# Patient Record
Sex: Male | Born: 1955 | Race: White | Hispanic: No | Marital: Married | Smoking: Current every day smoker
Health system: Southern US, Community
[De-identification: ages and names within clinical notes are randomized; demographics above are authoritative.]

## PROBLEM LIST (undated history)

## (undated) DIAGNOSIS — J449 Chronic obstructive pulmonary disease, unspecified: Secondary | ICD-10-CM

## (undated) DIAGNOSIS — K7460 Unspecified cirrhosis of liver: Secondary | ICD-10-CM

## (undated) DIAGNOSIS — C3490 Malignant neoplasm of unspecified part of unspecified bronchus or lung: Secondary | ICD-10-CM

---

## 1999-02-04 DIAGNOSIS — K7460 Unspecified cirrhosis of liver: Secondary | ICD-10-CM

## 1999-02-04 HISTORY — DX: Unspecified cirrhosis of liver: K74.60

## 2008-03-10 ENCOUNTER — Emergency Department (HOSPITAL_COMMUNITY): Admission: EM | Admit: 2008-03-10 | Discharge: 2008-03-10 | Payer: Self-pay | Admitting: Emergency Medicine

## 2010-05-21 LAB — DIFFERENTIAL
Basophils Absolute: 0.1 10*3/uL (ref 0.0–0.1)
Lymphocytes Relative: 30 % (ref 12–46)
Lymphs Abs: 2.1 10*3/uL (ref 0.7–4.0)
Neutro Abs: 4.1 10*3/uL (ref 1.7–7.7)
Neutrophils Relative %: 58 % (ref 43–77)

## 2010-05-21 LAB — COMPREHENSIVE METABOLIC PANEL
AST: 38 U/L — ABNORMAL HIGH (ref 0–37)
BUN: 14 mg/dL (ref 6–23)
CO2: 25 mEq/L (ref 19–32)
Calcium: 9 mg/dL (ref 8.4–10.5)
Chloride: 107 mEq/L (ref 96–112)
Creatinine, Ser: 0.79 mg/dL (ref 0.4–1.5)
GFR calc Af Amer: >60 mL/min (ref >60)
GFR calc non Af Amer: >60 mL/min (ref >60)
Glucose, Bld: 133 mg/dL — ABNORMAL HIGH (ref 70–99)
Total Bilirubin: 0.5 mg/dL (ref 0.3–1.2)

## 2010-05-21 LAB — LIPASE, BLOOD: Lipase: 19 U/L (ref 11–59)

## 2010-05-21 LAB — CBC
HCT: 40.7 % (ref 39.0–52.0)
MCHC: 34.8 g/dL (ref 30.0–36.0)
MCV: 86.9 fL (ref 78.0–100.0)
RBC: 4.68 MIL/uL (ref 4.22–5.81)

## 2013-04-23 DIAGNOSIS — I6529 Occlusion and stenosis of unspecified carotid artery: Secondary | ICD-10-CM | POA: Diagnosis not present

## 2013-04-23 DIAGNOSIS — J449 Chronic obstructive pulmonary disease, unspecified: Secondary | ICD-10-CM | POA: Diagnosis not present

## 2013-04-23 DIAGNOSIS — I6350 Cerebral infarction due to unspecified occlusion or stenosis of unspecified cerebral artery: Secondary | ICD-10-CM | POA: Diagnosis not present

## 2013-04-23 DIAGNOSIS — G4733 Obstructive sleep apnea (adult) (pediatric): Secondary | ICD-10-CM | POA: Diagnosis not present

## 2013-04-24 DIAGNOSIS — J449 Chronic obstructive pulmonary disease, unspecified: Secondary | ICD-10-CM | POA: Diagnosis not present

## 2013-04-24 DIAGNOSIS — I6350 Cerebral infarction due to unspecified occlusion or stenosis of unspecified cerebral artery: Secondary | ICD-10-CM | POA: Diagnosis not present

## 2013-04-24 DIAGNOSIS — I6529 Occlusion and stenosis of unspecified carotid artery: Secondary | ICD-10-CM | POA: Diagnosis not present

## 2013-04-24 DIAGNOSIS — G4733 Obstructive sleep apnea (adult) (pediatric): Secondary | ICD-10-CM | POA: Diagnosis not present

## 2013-04-25 DIAGNOSIS — I6350 Cerebral infarction due to unspecified occlusion or stenosis of unspecified cerebral artery: Secondary | ICD-10-CM | POA: Diagnosis not present

## 2013-04-25 DIAGNOSIS — E785 Hyperlipidemia, unspecified: Secondary | ICD-10-CM | POA: Diagnosis not present

## 2013-04-25 DIAGNOSIS — F319 Bipolar disorder, unspecified: Secondary | ICD-10-CM | POA: Diagnosis not present

## 2013-04-25 DIAGNOSIS — R209 Unspecified disturbances of skin sensation: Secondary | ICD-10-CM | POA: Diagnosis not present

## 2013-04-25 DIAGNOSIS — I69998 Other sequelae following unspecified cerebrovascular disease: Secondary | ICD-10-CM | POA: Diagnosis not present

## 2013-04-25 DIAGNOSIS — G4733 Obstructive sleep apnea (adult) (pediatric): Secondary | ICD-10-CM | POA: Diagnosis not present

## 2013-04-25 DIAGNOSIS — I6529 Occlusion and stenosis of unspecified carotid artery: Secondary | ICD-10-CM | POA: Diagnosis not present

## 2013-04-25 DIAGNOSIS — I10 Essential (primary) hypertension: Secondary | ICD-10-CM | POA: Diagnosis not present

## 2013-04-25 DIAGNOSIS — K7689 Other specified diseases of liver: Secondary | ICD-10-CM | POA: Diagnosis not present

## 2013-04-25 DIAGNOSIS — F17200 Nicotine dependence, unspecified, uncomplicated: Secondary | ICD-10-CM | POA: Diagnosis not present

## 2013-04-25 DIAGNOSIS — R5383 Other fatigue: Secondary | ICD-10-CM | POA: Diagnosis not present

## 2013-04-25 DIAGNOSIS — R5381 Other malaise: Secondary | ICD-10-CM | POA: Diagnosis not present

## 2013-04-25 DIAGNOSIS — E119 Type 2 diabetes mellitus without complications: Secondary | ICD-10-CM | POA: Diagnosis not present

## 2013-04-25 DIAGNOSIS — J449 Chronic obstructive pulmonary disease, unspecified: Secondary | ICD-10-CM | POA: Diagnosis not present

## 2013-04-26 DIAGNOSIS — I6529 Occlusion and stenosis of unspecified carotid artery: Secondary | ICD-10-CM | POA: Diagnosis not present

## 2013-04-26 DIAGNOSIS — G4733 Obstructive sleep apnea (adult) (pediatric): Secondary | ICD-10-CM | POA: Diagnosis not present

## 2013-04-26 DIAGNOSIS — I6350 Cerebral infarction due to unspecified occlusion or stenosis of unspecified cerebral artery: Secondary | ICD-10-CM | POA: Diagnosis not present

## 2013-04-26 DIAGNOSIS — J449 Chronic obstructive pulmonary disease, unspecified: Secondary | ICD-10-CM | POA: Diagnosis not present

## 2013-04-27 DIAGNOSIS — R5381 Other malaise: Secondary | ICD-10-CM | POA: Diagnosis not present

## 2013-04-27 DIAGNOSIS — J449 Chronic obstructive pulmonary disease, unspecified: Secondary | ICD-10-CM | POA: Diagnosis not present

## 2013-04-27 DIAGNOSIS — E785 Hyperlipidemia, unspecified: Secondary | ICD-10-CM | POA: Diagnosis not present

## 2013-04-27 DIAGNOSIS — I10 Essential (primary) hypertension: Secondary | ICD-10-CM | POA: Diagnosis not present

## 2013-04-27 DIAGNOSIS — R5383 Other fatigue: Secondary | ICD-10-CM | POA: Diagnosis not present

## 2013-04-27 DIAGNOSIS — E119 Type 2 diabetes mellitus without complications: Secondary | ICD-10-CM | POA: Diagnosis not present

## 2013-04-27 DIAGNOSIS — G4733 Obstructive sleep apnea (adult) (pediatric): Secondary | ICD-10-CM | POA: Diagnosis not present

## 2013-04-27 DIAGNOSIS — I6350 Cerebral infarction due to unspecified occlusion or stenosis of unspecified cerebral artery: Secondary | ICD-10-CM | POA: Diagnosis not present

## 2013-04-27 DIAGNOSIS — F17200 Nicotine dependence, unspecified, uncomplicated: Secondary | ICD-10-CM | POA: Diagnosis not present

## 2013-04-27 DIAGNOSIS — F319 Bipolar disorder, unspecified: Secondary | ICD-10-CM | POA: Diagnosis not present

## 2013-04-27 DIAGNOSIS — R209 Unspecified disturbances of skin sensation: Secondary | ICD-10-CM | POA: Diagnosis not present

## 2013-04-27 DIAGNOSIS — K7689 Other specified diseases of liver: Secondary | ICD-10-CM | POA: Diagnosis not present

## 2013-04-27 DIAGNOSIS — I6529 Occlusion and stenosis of unspecified carotid artery: Secondary | ICD-10-CM | POA: Diagnosis not present

## 2013-04-27 DIAGNOSIS — I69998 Other sequelae following unspecified cerebrovascular disease: Secondary | ICD-10-CM | POA: Diagnosis not present

## 2013-04-28 DIAGNOSIS — I6350 Cerebral infarction due to unspecified occlusion or stenosis of unspecified cerebral artery: Secondary | ICD-10-CM | POA: Diagnosis not present

## 2013-04-28 DIAGNOSIS — I6529 Occlusion and stenosis of unspecified carotid artery: Secondary | ICD-10-CM | POA: Diagnosis not present

## 2013-04-28 DIAGNOSIS — J449 Chronic obstructive pulmonary disease, unspecified: Secondary | ICD-10-CM | POA: Diagnosis not present

## 2013-04-28 DIAGNOSIS — G4733 Obstructive sleep apnea (adult) (pediatric): Secondary | ICD-10-CM | POA: Diagnosis not present

## 2013-06-01 DIAGNOSIS — I10 Essential (primary) hypertension: Secondary | ICD-10-CM | POA: Diagnosis not present

## 2013-06-01 DIAGNOSIS — G4733 Obstructive sleep apnea (adult) (pediatric): Secondary | ICD-10-CM | POA: Diagnosis not present

## 2013-06-01 DIAGNOSIS — I6529 Occlusion and stenosis of unspecified carotid artery: Secondary | ICD-10-CM | POA: Diagnosis not present

## 2015-04-09 DIAGNOSIS — J0100 Acute maxillary sinusitis, unspecified: Secondary | ICD-10-CM | POA: Diagnosis not present

## 2015-06-22 DIAGNOSIS — J0100 Acute maxillary sinusitis, unspecified: Secondary | ICD-10-CM | POA: Diagnosis not present

## 2017-01-13 DIAGNOSIS — J9611 Chronic respiratory failure with hypoxia: Secondary | ICD-10-CM | POA: Diagnosis not present

## 2017-01-13 DIAGNOSIS — Z85118 Personal history of other malignant neoplasm of bronchus and lung: Secondary | ICD-10-CM | POA: Diagnosis not present

## 2017-01-13 DIAGNOSIS — J9622 Acute and chronic respiratory failure with hypercapnia: Secondary | ICD-10-CM | POA: Diagnosis not present

## 2017-01-13 DIAGNOSIS — J181 Lobar pneumonia, unspecified organism: Secondary | ICD-10-CM | POA: Diagnosis not present

## 2017-01-13 DIAGNOSIS — Z720 Tobacco use: Secondary | ICD-10-CM | POA: Diagnosis not present

## 2017-01-13 DIAGNOSIS — Z91041 Radiographic dye allergy status: Secondary | ICD-10-CM | POA: Diagnosis not present

## 2017-01-13 DIAGNOSIS — R05 Cough: Secondary | ICD-10-CM | POA: Diagnosis not present

## 2017-01-13 DIAGNOSIS — R071 Chest pain on breathing: Secondary | ICD-10-CM | POA: Diagnosis not present

## 2017-01-13 DIAGNOSIS — J440 Chronic obstructive pulmonary disease with acute lower respiratory infection: Secondary | ICD-10-CM | POA: Diagnosis present

## 2017-01-13 DIAGNOSIS — Z8673 Personal history of transient ischemic attack (TIA), and cerebral infarction without residual deficits: Secondary | ICD-10-CM | POA: Diagnosis not present

## 2017-01-13 DIAGNOSIS — Z955 Presence of coronary angioplasty implant and graft: Secondary | ICD-10-CM | POA: Diagnosis not present

## 2017-01-13 DIAGNOSIS — Z7982 Long term (current) use of aspirin: Secondary | ICD-10-CM | POA: Diagnosis not present

## 2017-01-13 DIAGNOSIS — I252 Old myocardial infarction: Secondary | ICD-10-CM | POA: Diagnosis not present

## 2017-01-13 DIAGNOSIS — K219 Gastro-esophageal reflux disease without esophagitis: Secondary | ICD-10-CM | POA: Diagnosis present

## 2017-01-13 DIAGNOSIS — R918 Other nonspecific abnormal finding of lung field: Secondary | ICD-10-CM | POA: Diagnosis present

## 2017-01-13 DIAGNOSIS — J441 Chronic obstructive pulmonary disease with (acute) exacerbation: Secondary | ICD-10-CM | POA: Diagnosis not present

## 2017-01-13 DIAGNOSIS — E119 Type 2 diabetes mellitus without complications: Secondary | ICD-10-CM | POA: Diagnosis present

## 2017-01-13 DIAGNOSIS — R079 Chest pain, unspecified: Secondary | ICD-10-CM | POA: Diagnosis not present

## 2017-01-13 DIAGNOSIS — J9690 Respiratory failure, unspecified, unspecified whether with hypoxia or hypercapnia: Secondary | ICD-10-CM | POA: Diagnosis not present

## 2017-01-13 DIAGNOSIS — Z888 Allergy status to other drugs, medicaments and biological substances status: Secondary | ICD-10-CM | POA: Diagnosis not present

## 2017-01-13 DIAGNOSIS — J189 Pneumonia, unspecified organism: Secondary | ICD-10-CM | POA: Diagnosis not present

## 2017-01-13 DIAGNOSIS — J9621 Acute and chronic respiratory failure with hypoxia: Secondary | ICD-10-CM | POA: Diagnosis not present

## 2017-01-13 DIAGNOSIS — I2510 Atherosclerotic heart disease of native coronary artery without angina pectoris: Secondary | ICD-10-CM | POA: Diagnosis present

## 2017-01-13 DIAGNOSIS — Z9981 Dependence on supplemental oxygen: Secondary | ICD-10-CM | POA: Diagnosis not present

## 2017-01-13 DIAGNOSIS — Z79899 Other long term (current) drug therapy: Secondary | ICD-10-CM | POA: Diagnosis not present

## 2017-01-13 DIAGNOSIS — R0602 Shortness of breath: Secondary | ICD-10-CM | POA: Diagnosis not present

## 2017-01-13 DIAGNOSIS — F17210 Nicotine dependence, cigarettes, uncomplicated: Secondary | ICD-10-CM | POA: Diagnosis present

## 2017-01-13 DIAGNOSIS — J159 Unspecified bacterial pneumonia: Secondary | ICD-10-CM | POA: Diagnosis not present

## 2017-01-14 DIAGNOSIS — R0602 Shortness of breath: Secondary | ICD-10-CM

## 2017-01-16 DIAGNOSIS — Z9981 Dependence on supplemental oxygen: Secondary | ICD-10-CM | POA: Diagnosis not present

## 2017-01-16 DIAGNOSIS — J9611 Chronic respiratory failure with hypoxia: Secondary | ICD-10-CM | POA: Diagnosis not present

## 2017-01-16 DIAGNOSIS — J441 Chronic obstructive pulmonary disease with (acute) exacerbation: Secondary | ICD-10-CM | POA: Diagnosis not present

## 2017-01-17 DIAGNOSIS — J189 Pneumonia, unspecified organism: Secondary | ICD-10-CM

## 2017-01-20 DIAGNOSIS — G4733 Obstructive sleep apnea (adult) (pediatric): Secondary | ICD-10-CM | POA: Diagnosis not present

## 2017-01-20 DIAGNOSIS — R918 Other nonspecific abnormal finding of lung field: Secondary | ICD-10-CM | POA: Diagnosis not present

## 2017-01-20 DIAGNOSIS — F17210 Nicotine dependence, cigarettes, uncomplicated: Secondary | ICD-10-CM | POA: Diagnosis not present

## 2017-01-20 DIAGNOSIS — J301 Allergic rhinitis due to pollen: Secondary | ICD-10-CM | POA: Diagnosis not present

## 2017-01-20 DIAGNOSIS — J449 Chronic obstructive pulmonary disease, unspecified: Secondary | ICD-10-CM | POA: Diagnosis not present

## 2017-01-20 DIAGNOSIS — R5383 Other fatigue: Secondary | ICD-10-CM | POA: Diagnosis not present

## 2017-02-06 ENCOUNTER — Other Ambulatory Visit: Payer: Self-pay

## 2017-02-06 ENCOUNTER — Emergency Department (HOSPITAL_COMMUNITY): Payer: Medicare Other

## 2017-02-06 ENCOUNTER — Inpatient Hospital Stay (HOSPITAL_COMMUNITY)
Admission: EM | Admit: 2017-02-06 | Discharge: 2017-02-10 | DRG: 190 | Disposition: A | Discharge status: Home Health | Payer: Medicare Other | Attending: Internal Medicine | Admitting: Internal Medicine

## 2017-02-06 ENCOUNTER — Encounter (HOSPITAL_COMMUNITY): Payer: Self-pay | Admitting: Emergency Medicine

## 2017-02-06 DIAGNOSIS — J9621 Acute and chronic respiratory failure with hypoxia: Secondary | ICD-10-CM | POA: Diagnosis present

## 2017-02-06 DIAGNOSIS — J9620 Acute and chronic respiratory failure, unspecified whether with hypoxia or hypercapnia: Secondary | ICD-10-CM | POA: Diagnosis present

## 2017-02-06 DIAGNOSIS — Z9119 Patient's noncompliance with other medical treatment and regimen: Secondary | ICD-10-CM

## 2017-02-06 DIAGNOSIS — Z923 Personal history of irradiation: Secondary | ICD-10-CM

## 2017-02-06 DIAGNOSIS — Z9114 Patient's other noncompliance with medication regimen: Secondary | ICD-10-CM | POA: Diagnosis not present

## 2017-02-06 DIAGNOSIS — J440 Chronic obstructive pulmonary disease with acute lower respiratory infection: Secondary | ICD-10-CM | POA: Diagnosis present

## 2017-02-06 DIAGNOSIS — E785 Hyperlipidemia, unspecified: Secondary | ICD-10-CM | POA: Diagnosis present

## 2017-02-06 DIAGNOSIS — Z7902 Long term (current) use of antithrombotics/antiplatelets: Secondary | ICD-10-CM | POA: Diagnosis not present

## 2017-02-06 DIAGNOSIS — J9601 Acute respiratory failure with hypoxia: Secondary | ICD-10-CM

## 2017-02-06 DIAGNOSIS — G4733 Obstructive sleep apnea (adult) (pediatric): Secondary | ICD-10-CM | POA: Diagnosis present

## 2017-02-06 DIAGNOSIS — Z8673 Personal history of transient ischemic attack (TIA), and cerebral infarction without residual deficits: Secondary | ICD-10-CM

## 2017-02-06 DIAGNOSIS — E669 Obesity, unspecified: Secondary | ICD-10-CM | POA: Diagnosis present

## 2017-02-06 DIAGNOSIS — Z7951 Long term (current) use of inhaled steroids: Secondary | ICD-10-CM

## 2017-02-06 DIAGNOSIS — G9349 Other encephalopathy: Secondary | ICD-10-CM | POA: Diagnosis present

## 2017-02-06 DIAGNOSIS — Z9981 Dependence on supplemental oxygen: Secondary | ICD-10-CM

## 2017-02-06 DIAGNOSIS — N179 Acute kidney failure, unspecified: Secondary | ICD-10-CM | POA: Diagnosis present

## 2017-02-06 DIAGNOSIS — J441 Chronic obstructive pulmonary disease with (acute) exacerbation: Principal | ICD-10-CM

## 2017-02-06 DIAGNOSIS — I10 Essential (primary) hypertension: Secondary | ICD-10-CM | POA: Diagnosis present

## 2017-02-06 DIAGNOSIS — G894 Chronic pain syndrome: Secondary | ICD-10-CM | POA: Diagnosis present

## 2017-02-06 DIAGNOSIS — R069 Unspecified abnormalities of breathing: Secondary | ICD-10-CM | POA: Diagnosis not present

## 2017-02-06 DIAGNOSIS — J9602 Acute respiratory failure with hypercapnia: Secondary | ICD-10-CM

## 2017-02-06 DIAGNOSIS — K219 Gastro-esophageal reflux disease without esophagitis: Secondary | ICD-10-CM | POA: Diagnosis present

## 2017-02-06 DIAGNOSIS — Z6834 Body mass index (BMI) 34.0-34.9, adult: Secondary | ICD-10-CM

## 2017-02-06 DIAGNOSIS — Z85118 Personal history of other malignant neoplasm of bronchus and lung: Secondary | ICD-10-CM

## 2017-02-06 DIAGNOSIS — F17210 Nicotine dependence, cigarettes, uncomplicated: Secondary | ICD-10-CM | POA: Diagnosis present

## 2017-02-06 DIAGNOSIS — E1165 Type 2 diabetes mellitus with hyperglycemia: Secondary | ICD-10-CM | POA: Diagnosis present

## 2017-02-06 DIAGNOSIS — K869 Disease of pancreas, unspecified: Secondary | ICD-10-CM | POA: Diagnosis present

## 2017-02-06 DIAGNOSIS — I509 Heart failure, unspecified: Secondary | ICD-10-CM | POA: Diagnosis not present

## 2017-02-06 DIAGNOSIS — J9622 Acute and chronic respiratory failure with hypercapnia: Secondary | ICD-10-CM | POA: Diagnosis not present

## 2017-02-06 DIAGNOSIS — B9729 Other coronavirus as the cause of diseases classified elsewhere: Secondary | ICD-10-CM | POA: Diagnosis present

## 2017-02-06 DIAGNOSIS — J208 Acute bronchitis due to other specified organisms: Secondary | ICD-10-CM | POA: Diagnosis present

## 2017-02-06 DIAGNOSIS — Z7982 Long term (current) use of aspirin: Secondary | ICD-10-CM

## 2017-02-06 DIAGNOSIS — K7460 Unspecified cirrhosis of liver: Secondary | ICD-10-CM | POA: Diagnosis present

## 2017-02-06 DIAGNOSIS — E872 Acidosis: Secondary | ICD-10-CM | POA: Diagnosis present

## 2017-02-06 HISTORY — DX: Malignant neoplasm of unspecified part of unspecified bronchus or lung: C34.90

## 2017-02-06 HISTORY — DX: Chronic obstructive pulmonary disease, unspecified: J44.9

## 2017-02-06 LAB — I-STAT ARTERIAL BLOOD GAS, ED
ACID-BASE EXCESS: 5 mmol/L — AB (ref 0.0–2.0)
Acid-Base Excess: 3 mmol/L — ABNORMAL HIGH (ref 0.0–2.0)
BICARBONATE: 31.4 mmol/L — AB (ref 20.0–28.0)
BICARBONATE: 34.1 mmol/L — AB (ref 20.0–28.0)
O2 Saturation: 98 %
O2 Saturation: 97 %
PH ART: 7.303 — AB (ref 7.350–7.450)
TCO2: 36 mmol/L — AB (ref 22–32)
TCO2: 33 mmol/L — ABNORMAL HIGH (ref 22–32)
pCO2 arterial: 74 mmHg (ref 32.0–48.0)
pCO2 arterial: 63.4 mmHg — ABNORMAL HIGH (ref 32.0–48.0)
pH, Arterial: 7.272 — ABNORMAL LOW (ref 7.350–7.450)
pO2, Arterial: 128 mmHg — ABNORMAL HIGH (ref 83.0–108.0)
pO2, Arterial: 104 mmHg (ref 83.0–108.0)

## 2017-02-06 LAB — BASIC METABOLIC PANEL
Anion gap: 10 (ref 5–15)
BUN: 35 mg/dL — ABNORMAL HIGH (ref 6–20)
CALCIUM: 9.4 mg/dL (ref 8.9–10.3)
CO2: 34 mmol/L — AB (ref 22–32)
CREATININE: 1.56 mg/dL — AB (ref 0.61–1.24)
Chloride: 96 mmol/L — ABNORMAL LOW (ref 101–111)
GFR calc non Af Amer: 46 mL/min — ABNORMAL LOW (ref >60)
GFR, EST AFRICAN AMERICAN: 53 mL/min — AB (ref >60)
Glucose, Bld: 162 mg/dL — ABNORMAL HIGH (ref 65–99)
Potassium: 5.1 mmol/L (ref 3.5–5.1)
Sodium: 140 mmol/L (ref 135–145)

## 2017-02-06 LAB — CBC
HCT: 40.7 % (ref 39.0–52.0)
Hemoglobin: 12.2 g/dL — ABNORMAL LOW (ref 13.0–17.0)
MCH: 27.7 pg (ref 26.0–34.0)
MCHC: 30 g/dL (ref 30.0–36.0)
MCV: 92.3 fL (ref 78.0–100.0)
Platelets: 124 10*3/uL — ABNORMAL LOW (ref 150–400)
RBC: 4.41 MIL/uL (ref 4.22–5.81)
RDW: 17.6 % — ABNORMAL HIGH (ref 11.5–15.5)
WBC: 4.4 10*3/uL (ref 4.0–10.5)

## 2017-02-06 LAB — I-STAT TROPONIN, ED: TROPONIN I, POC: 0.03 ng/mL (ref 0.00–0.08)

## 2017-02-06 LAB — CBG MONITORING, ED: Glucose-Capillary: 150 mg/dL — ABNORMAL HIGH (ref 65–99)

## 2017-02-06 LAB — I-STAT CG4 LACTIC ACID, ED: Lactic Acid, Venous: 1.76 mmol/L (ref 0.5–1.9)

## 2017-02-06 MED ORDER — NALOXONE HCL 2 MG/2ML IJ SOSY
1.0000 mg | PREFILLED_SYRINGE | Freq: Once | INTRAMUSCULAR | Status: AC
  Administered 2017-02-06: 1 mg via INTRAVENOUS

## 2017-02-06 MED ORDER — ARFORMOTEROL TARTRATE 15 MCG/2ML IN NEBU
15.0000 ug | INHALATION_SOLUTION | Freq: Two times a day (BID) | RESPIRATORY_TRACT | Status: DC
  Administered 2017-02-07 – 2017-02-10 (×8): 15 ug via RESPIRATORY_TRACT
  Filled 2017-02-06 (×9): qty 2

## 2017-02-06 MED ORDER — NALOXONE HCL 2 MG/2ML IJ SOSY
PREFILLED_SYRINGE | INTRAMUSCULAR | Status: AC
  Filled 2017-02-06: qty 2

## 2017-02-06 MED ORDER — BUDESONIDE 0.25 MG/2ML IN SUSP
0.2500 mg | Freq: Two times a day (BID) | RESPIRATORY_TRACT | Status: DC
  Administered 2017-02-07 (×2): 0.25 mg via RESPIRATORY_TRACT
  Filled 2017-02-06 (×3): qty 2

## 2017-02-06 MED ORDER — INSULIN ASPART 100 UNIT/ML ~~LOC~~ SOLN: 2.0000 [IU] | SUBCUTANEOUS | Status: DC

## 2017-02-06 MED ORDER — NALOXONE HCL 0.4 MG/ML IJ SOLN
0.4000 mg | Freq: Once | INTRAMUSCULAR | Status: AC
  Administered 2017-02-06: 0.4 mg via INTRAVENOUS

## 2017-02-06 MED ORDER — ALBUTEROL (5 MG/ML) CONTINUOUS INHALATION SOLN
10.0000 mg/h | INHALATION_SOLUTION | RESPIRATORY_TRACT | Status: DC
  Administered 2017-02-06: 10 mg/h via RESPIRATORY_TRACT
  Filled 2017-02-06: qty 20

## 2017-02-06 MED ORDER — IPRATROPIUM-ALBUTEROL 0.5-2.5 (3) MG/3ML IN SOLN
3.0000 mL | Freq: Four times a day (QID) | RESPIRATORY_TRACT | Status: DC
  Administered 2017-02-07 – 2017-02-10 (×12): 3 mL via RESPIRATORY_TRACT
  Filled 2017-02-06 (×13): qty 3

## 2017-02-06 MED ORDER — PANTOPRAZOLE SODIUM 40 MG IV SOLR
40.0000 mg | INTRAVENOUS | Status: DC
  Administered 2017-02-07 (×2): 40 mg via INTRAVENOUS
  Filled 2017-02-06 (×2): qty 40

## 2017-02-06 MED ORDER — HEPARIN SODIUM (PORCINE) 5000 UNIT/ML IJ SOLN
5000.0000 [IU] | Freq: Three times a day (TID) | INTRAMUSCULAR | Status: DC
  Administered 2017-02-07 – 2017-02-10 (×9): 5000 [IU] via SUBCUTANEOUS
  Filled 2017-02-06 (×9): qty 1

## 2017-02-06 NOTE — ED Notes (Signed)
Additional family at bedside

## 2017-02-06 NOTE — ED Notes (Signed)
Repeat ABG done, RT and Dr. Jeneen Rinks discussing plan of care.  Wife at bedside states pt has been in same condition in the past and worse. Awaiting son to arrive for further discussion

## 2017-02-06 NOTE — ED Triage Notes (Signed)
Patient from home with increased shortness of breath in the last day.  Patient with decreased mental status per EMS en route to ED.  Patient has history of COPD, wheezing and rales in all fields.  Patient was given albuterol treatment en route to ED.  Patient placed on Bipap on arrival to ED.

## 2017-02-07 ENCOUNTER — Encounter (HOSPITAL_COMMUNITY): Payer: Self-pay | Admitting: *Deleted

## 2017-02-07 ENCOUNTER — Other Ambulatory Visit: Payer: Self-pay

## 2017-02-07 DIAGNOSIS — J9622 Acute and chronic respiratory failure with hypercapnia: Secondary | ICD-10-CM

## 2017-02-07 DIAGNOSIS — J441 Chronic obstructive pulmonary disease with (acute) exacerbation: Secondary | ICD-10-CM | POA: Insufficient documentation

## 2017-02-07 DIAGNOSIS — J9602 Acute respiratory failure with hypercapnia: Secondary | ICD-10-CM | POA: Insufficient documentation

## 2017-02-07 LAB — RESPIRATORY PANEL BY PCR
ADENOVIRUS-RVPPCR: NOT DETECTED
Bordetella pertussis: NOT DETECTED
CORONAVIRUS HKU1-RVPPCR: NOT DETECTED
CORONAVIRUS NL63-RVPPCR: NOT DETECTED
CORONAVIRUS OC43-RVPPCR: DETECTED — AB
Chlamydophila pneumoniae: NOT DETECTED
Coronavirus 229E: NOT DETECTED
INFLUENZA A-RVPPCR: NOT DETECTED
Influenza B: NOT DETECTED
Metapneumovirus: NOT DETECTED
Mycoplasma pneumoniae: NOT DETECTED
PARAINFLUENZA VIRUS 1-RVPPCR: NOT DETECTED
PARAINFLUENZA VIRUS 2-RVPPCR: NOT DETECTED
PARAINFLUENZA VIRUS 4-RVPPCR: NOT DETECTED
Parainfluenza Virus 3: NOT DETECTED
Respiratory Syncytial Virus: NOT DETECTED
Rhinovirus / Enterovirus: NOT DETECTED

## 2017-02-07 LAB — URINALYSIS, ROUTINE W REFLEX MICROSCOPIC
Bilirubin Urine: NEGATIVE
Glucose, UA: 50 mg/dL — AB
Hgb urine dipstick: NEGATIVE
Ketones, ur: NEGATIVE mg/dL
LEUKOCYTES UA: NEGATIVE
Nitrite: NEGATIVE
Protein, ur: NEGATIVE mg/dL
SPECIFIC GRAVITY, URINE: 1.017 (ref 1.005–1.030)
pH: 5 (ref 5.0–8.0)

## 2017-02-07 LAB — GLUCOSE, CAPILLARY
GLUCOSE-CAPILLARY: 129 mg/dL — AB (ref 65–99)
GLUCOSE-CAPILLARY: 180 mg/dL — AB (ref 65–99)
GLUCOSE-CAPILLARY: 227 mg/dL — AB (ref 65–99)
GLUCOSE-CAPILLARY: 241 mg/dL — AB (ref 65–99)
Glucose-Capillary: 148 mg/dL — ABNORMAL HIGH (ref 65–99)
Glucose-Capillary: 216 mg/dL — ABNORMAL HIGH (ref 65–99)

## 2017-02-07 LAB — CBG MONITORING, ED: GLUCOSE-CAPILLARY: 259 mg/dL — AB (ref 65–99)

## 2017-02-07 LAB — BLOOD GAS, ARTERIAL
ACID-BASE EXCESS: 8.6 mmol/L — AB (ref 0.0–2.0)
Bicarbonate: 34 mmol/L — ABNORMAL HIGH (ref 20.0–28.0)
DRAWN BY: 51702
Delivery systems: POSITIVE
Expiratory PAP: 5
FIO2: 40
INSPIRATORY PAP: 17
O2 SAT: 96.5 %
PCO2 ART: 61.1 mmHg — AB (ref 32.0–48.0)
PO2 ART: 91.9 mmHg (ref 83.0–108.0)
Patient temperature: 98.6
pH, Arterial: 7.364 (ref 7.350–7.450)

## 2017-02-07 LAB — BASIC METABOLIC PANEL
Anion gap: 13 (ref 5–15)
BUN: 39 mg/dL — AB (ref 6–20)
CHLORIDE: 95 mmol/L — AB (ref 101–111)
CO2: 32 mmol/L (ref 22–32)
CREATININE: 1.49 mg/dL — AB (ref 0.61–1.24)
Calcium: 9.2 mg/dL (ref 8.9–10.3)
GFR calc Af Amer: 56 mL/min — ABNORMAL LOW (ref >60)
GFR calc non Af Amer: 49 mL/min — ABNORMAL LOW (ref >60)
GLUCOSE: 247 mg/dL — AB (ref 65–99)
POTASSIUM: 3.9 mmol/L (ref 3.5–5.1)
SODIUM: 140 mmol/L (ref 135–145)

## 2017-02-07 LAB — HEPATIC FUNCTION PANEL
ALBUMIN: 3.1 g/dL — AB (ref 3.5–5.0)
ALK PHOS: 97 U/L (ref 38–126)
ALT: 40 U/L (ref 17–63)
AST: 36 U/L (ref 15–41)
BILIRUBIN INDIRECT: 0.6 mg/dL (ref 0.3–0.9)
Bilirubin, Direct: 0.2 mg/dL (ref 0.1–0.5)
TOTAL PROTEIN: 6.9 g/dL (ref 6.5–8.1)
Total Bilirubin: 0.8 mg/dL (ref 0.3–1.2)

## 2017-02-07 LAB — INFLUENZA PANEL BY PCR (TYPE A & B)
Influenza A By PCR: NEGATIVE
Influenza B By PCR: NEGATIVE

## 2017-02-07 LAB — CBC
HEMATOCRIT: 36.3 % — AB (ref 39.0–52.0)
Hemoglobin: 11.2 g/dL — ABNORMAL LOW (ref 13.0–17.0)
MCH: 27.9 pg (ref 26.0–34.0)
MCHC: 30.9 g/dL (ref 30.0–36.0)
MCV: 90.5 fL (ref 78.0–100.0)
PLATELETS: 120 10*3/uL — AB (ref 150–400)
RBC: 4.01 MIL/uL — ABNORMAL LOW (ref 4.22–5.81)
RDW: 17.6 % — AB (ref 11.5–15.5)
WBC: 2.7 10*3/uL — AB (ref 4.0–10.5)

## 2017-02-07 LAB — MAGNESIUM: Magnesium: 2.1 mg/dL (ref 1.7–2.4)

## 2017-02-07 LAB — RAPID URINE DRUG SCREEN, HOSP PERFORMED
AMPHETAMINES: NOT DETECTED
BARBITURATES: NOT DETECTED
BENZODIAZEPINES: NOT DETECTED
Cocaine: NOT DETECTED
Opiates: POSITIVE — AB
Tetrahydrocannabinol: POSITIVE — AB

## 2017-02-07 LAB — PHOSPHORUS: Phosphorus: 2.9 mg/dL (ref 2.5–4.6)

## 2017-02-07 LAB — LACTIC ACID, PLASMA
LACTIC ACID, VENOUS: 3.9 mmol/L — AB (ref 0.5–1.9)
Lactic Acid, Venous: 4.4 mmol/L (ref 0.5–1.9)

## 2017-02-07 LAB — MRSA PCR SCREENING: MRSA by PCR: NEGATIVE

## 2017-02-07 LAB — HIV ANTIBODY (ROUTINE TESTING W REFLEX): HIV SCREEN 4TH GENERATION: NONREACTIVE

## 2017-02-07 LAB — AMMONIA: AMMONIA: 40 umol/L — AB (ref 9–35)

## 2017-02-07 LAB — PROCALCITONIN: Procalcitonin: 0.11 ng/mL

## 2017-02-07 MED ORDER — METHYLPREDNISOLONE SODIUM SUCC 40 MG IJ SOLR
40.0000 mg | Freq: Every day | INTRAMUSCULAR | Status: DC
  Administered 2017-02-07 – 2017-02-08 (×2): 40 mg via INTRAVENOUS
  Filled 2017-02-07 (×2): qty 1

## 2017-02-07 MED ORDER — BUDESONIDE 0.5 MG/2ML IN SUSP
0.5000 mg | Freq: Two times a day (BID) | RESPIRATORY_TRACT | Status: DC
  Administered 2017-02-07 – 2017-02-10 (×6): 0.5 mg via RESPIRATORY_TRACT
  Filled 2017-02-07 (×6): qty 2

## 2017-02-07 MED ORDER — ORAL CARE MOUTH RINSE
15.0000 mL | Freq: Two times a day (BID) | OROMUCOSAL | Status: DC
  Administered 2017-02-07 – 2017-02-09 (×4): 15 mL via OROMUCOSAL

## 2017-02-07 MED ORDER — FENTANYL CITRATE (PF) 100 MCG/2ML IJ SOLN
25.0000 ug | INTRAMUSCULAR | Status: DC | PRN
  Administered 2017-02-07 (×4): 50 ug via INTRAVENOUS
  Filled 2017-02-07 (×4): qty 2

## 2017-02-07 MED ORDER — INSULIN ASPART 100 UNIT/ML ~~LOC~~ SOLN
0.0000 [IU] | SUBCUTANEOUS | Status: DC
  Administered 2017-02-07: 8 [IU] via SUBCUTANEOUS
  Administered 2017-02-07: 5 [IU] via SUBCUTANEOUS
  Administered 2017-02-07: 3 [IU] via SUBCUTANEOUS
  Administered 2017-02-07: 5 [IU] via SUBCUTANEOUS
  Administered 2017-02-07: 2 [IU] via SUBCUTANEOUS
  Administered 2017-02-07: 5 [IU] via SUBCUTANEOUS
  Administered 2017-02-07 – 2017-02-08 (×2): 2 [IU] via SUBCUTANEOUS
  Administered 2017-02-08 (×2): 8 [IU] via SUBCUTANEOUS
  Administered 2017-02-08 (×2): 2 [IU] via SUBCUTANEOUS
  Administered 2017-02-08: 5 [IU] via SUBCUTANEOUS
  Administered 2017-02-09 (×2): 2 [IU] via SUBCUTANEOUS
  Administered 2017-02-09: 15 [IU] via SUBCUTANEOUS
  Filled 2017-02-07: qty 1

## 2017-02-07 MED ORDER — OXYCODONE HCL 5 MG PO TABS
5.0000 mg | ORAL_TABLET | Freq: Three times a day (TID) | ORAL | Status: DC | PRN
  Administered 2017-02-07 – 2017-02-10 (×8): 5 mg via ORAL
  Filled 2017-02-07 (×9): qty 1

## 2017-02-07 MED ORDER — PREGABALIN 75 MG PO CAPS
75.0000 mg | ORAL_CAPSULE | Freq: Two times a day (BID) | ORAL | Status: DC
  Administered 2017-02-07 – 2017-02-10 (×7): 75 mg via ORAL
  Filled 2017-02-07 (×7): qty 1

## 2017-02-07 NOTE — Progress Notes (Signed)
Mr. Manuel Baxter, previously noncompliant with CPAP at home, requests CPAP therapy for tonight.  Patient placed on CPAP, autotitration mode (min 5, max 20) using under the nose full face mask.  Patient states he has full face mask at home but does not use as he is supposed to.  Patient and family express need for equipment adjustment with home CPAP equipment.  Patient tolerates well.  3L O2 bleed in applied.  Patient has strong congested cough productive of thick yellow sputum.  Patient was instructed to notify RN if he removes CPAP during the night so the nasal cannula can be reapplied.  Patient expresses understanding.

## 2017-02-07 NOTE — Progress Notes (Signed)
[facility]M Follow Up AM Rounding   S:  RN reports pt asking for pain medications.  Has PRN fentanyl only.  No acute events overnight.    O: Blood pressure (!) 125/59, pulse 66, temperature 98 F (36.7 C), temperature source Oral, resp. rate 11, height 5\' 7"  (1.702 m), weight 167 lb 12.3 oz (76.1 kg), SpO2 100 %.  General: chronically ill appearing male in NAD HEENT: MM pink/moist PSY: calm  Neuro: AAOx4, speech clear, MAE  CV: s1s2 rrr, no m/r/g PULM: even/non-labored, lungs bilaterally coarse with wheezing  DU:KRCV, non-tender, bsx4 active  Extremities: warm/dry, no edema  Skin: no rashes or lesions  Recent Labs  Lab 02/06/17 1950 02/07/17 0321  HGB 12.2* 11.2*  HCT 40.7 36.3*  WBC 4.4 2.7*  PLT 124* 120*   Recent Labs  Lab 02/06/17 1950 02/07/17 0321  NA 140 140  K 5.1 3.9  CL 96* 95*  CO2 34* 32  GLUCOSE 162* 247*  BUN 35* 39*  CREATININE 1.56* 1.49*  CALCIUM 9.4 9.2  MG  --  2.1  PHOS  --  2.9     A: Severe COPD  Coronavirus Positive  Non-Small Cell Lung CA  Right Hilar ATX vs Mass  Ongoing Tobacco Abuse  Acute on Chronic Respiratory Failure  CAD - on dual antiplatelet  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  Wife reports he has a PET scan arranged with Dr. Alcide Clever in [place].   Global - likely can transfer out of ICU later this afternoon.   Noe Gens, NP-C [facility] Pgr: [phone] or if no answer [phone] 02/07/2017,  11:40 AM

## 2017-02-07 NOTE — ED Provider Notes (Addendum)
[facility] EMERGENCY DEPARTMENT Provider Note   CSN: [id number] Arrival date & time: 02/06/17  1946     History   Chief Complaint Chief Complaint  Patient presents with  . Respiratory Distress    HPI Manuel Baxter is a 62 y.o. male. CC:  SOB  HPI:  62 y/o male. H/o COPD admitted at [facility] with pneumonia and Resp failure 4 weeks ago. DC'd with abx and steroids.  Has been staying with son since. Seemed "OK" this am. Altered and SOB this afternoon when son returned from work.   EMS called. "more confused" en route" per ems. Normal CBG.  [facility]--"they didn't send Rx".   Past Medical History:  Diagnosis Date  . COPD (chronic obstructive pulmonary disease) ([place])   . Lung cancer [facility])     Patient Active Problem List   Diagnosis Date Noted  . Acute on chronic respiratory failure ([place]) 02/06/2017    History reviewed. No pertinent surgical history.     Home Medications    Prior to Admission medications   Medication Sig Start Date End Date Taking? Authorizing Provider  albuterol (PROAIR HFA) 108 (90 Base) MCG/ACT inhaler Inhale 2 puffs into the lungs every 6 (six) hours as needed for wheezing or shortness of breath.   Yes [provider]  ALBUTEROL IN Take 1 vial by nebulization every 4 (four) hours as needed (shortness of breath/wheezing).   Yes [provider]  Alum Hydroxide-Mag Carbonate (GAVISCON PO) Take 1 tablet by mouth daily as needed (indigestion/heartburn).   Yes [provider]  aspirin EC 81 MG tablet Take 81 mg by mouth daily.   Yes [provider]  atenolol (TENORMIN) 50 MG tablet Take 50 mg by mouth 2 (two) times daily.   Yes [provider]  Cholecalciferol (VITAMIN D) 2000 units tablet Take 2,000 Units by mouth 2 (two) times daily.   Yes [provider]  clopidogrel (PLAVIX) 75 MG tablet Take 75 mg by mouth daily with supper.   Yes [provider]  cyanocobalamin 500 MCG tablet Take 500 mcg by mouth 2 (two) times daily. Vitamin B12   Yes [provider]  DULoxetine (CYMBALTA) 30 MG capsule Take 30 mg by mouth daily.   Yes [provider]  Fluticasone Furoate-Vilanterol (BREO ELLIPTA IN) Inhale 1 puff into the lungs daily.   Yes [provider]  glipiZIDE (GLUCOTROL) 5 MG tablet Take 2.5 mg by mouth 2 (two) times daily before a meal.   Yes [provider]  loratadine (CLARITIN) 10 MG tablet Take 10 mg by mouth daily.   Yes [provider]  montelukast (SINGULAIR) 10 MG tablet Take 10 mg by mouth daily with supper.   Yes [provider]  Omega-3 Fatty Acids (FISH OIL) 1000 MG CAPS Take 1,000-2,000 mg by mouth See admin instructions. Take 2 capsules (2000 mg) by mouth every morning and 1 capsule (1000 mg) before supper   Yes [provider]  omeprazole (PRILOSEC) 20 MG capsule Take 20 mg by mouth daily.   Yes [provider]  OXYGEN Inhale 2.5-3 L into the lungs continuous.   Yes [provider]  pregabalin (LYRICA) 75 MG capsule Take 75 mg by mouth 3 (three) times daily as needed (pain).   Yes [provider]  tiotropium (SPIRIVA) 18 MCG inhalation capsule Place 18 mcg into inhaler and inhale every evening.   Yes [provider]  triamterene-hydrochlorothiazide (  MAXZIDE-25) 37.5-25 MG tablet Take 1 tablet by mouth daily.   Yes [provider]    Family History No family history on file.  Social History Social History   Tobacco Use  . Smoking status: Not on file  Substance Use Topics  . Alcohol use: Not on file  . Drug use: Not on file     Allergies   Doxepin; Lisinopril; Acetaminophen; Contrast media [iodinated diagnostic agents]; Gabapentin; and Prozac [fluoxetine hcl]   Review of Systems Review of Systems  Unable to perform ROS: Mental status change     Physical Exam Updated Vital Signs BP 133/73    Pulse 89   Temp (!) 97.5 F (36.4 C) (Oral)   Resp 15   SpO2 100%   Physical Exam  Constitutional: No distress.   Tachypneic. Confused. BiPAP in place.   HENT:  Head: Normocephalic.  Eyes: Conjunctivae are normal. Pupils are equal, round, and reactive to light. No scleral icterus.  Neck: Normal range of motion. Neck supple. No thyromegaly present.  Cardiovascular: Normal rate and regular rhythm. Exam reveals no gallop and no friction rub.  No murmur heard. Pulmonary/Chest: He is in respiratory distress. He has wheezes. He has no rales.  Abdominal: Soft. Bowel sounds are normal. He exhibits no distension. There is no tenderness. There is no rebound.  Musculoskeletal: Normal range of motion.  Neurological: He is alert.  Skin: Skin is warm and dry. No rash noted.  Psychiatric: He has a normal mood and affect. His behavior is normal.     ED Treatments / Results  Labs (all labs ordered are listed, but only abnormal results are displayed) Labs Reviewed  BASIC METABOLIC PANEL - Abnormal; Notable for the following components:      Result Value   Chloride 96 (*)    CO2 34 (*)    Glucose, Bld 162 (*)    BUN 35 (*)    Creatinine, Ser 1.56 (*)    GFR calc non Af Amer 46 (*)    GFR calc Af Amer 53 (*)    All other components within normal limits  CBC - Abnormal; Notable for the following components:   Hemoglobin 12.2 (*)    RDW 17.6 (*)    Platelets 124 (*)    All other components within normal limits  I-STAT ARTERIAL BLOOD GAS, ED - Abnormal; Notable for the following components:   pH, Arterial 7.272 (*)    pCO2 arterial 74.0 (*)    pO2, Arterial 128.0 (*)    Bicarbonate 34.1 (*)    TCO2 36 (*)    Acid-Base Excess 5.0 (*)    All other components within normal limits  CBG MONITORING, ED - Abnormal; Notable for the following components:   Glucose-Capillary 150 (*)    All other components within normal limits  I-STAT ARTERIAL BLOOD GAS, ED - Abnormal; Notable for the  following components:   pH, Arterial 7.303 (*)    pCO2 arterial 63.4 (*)    Bicarbonate 31.4 (*)    TCO2 33 (*)    Acid-Base Excess 3.0 (*)    All other components within normal limits  RESPIRATORY PANEL BY PCR  CULTURE, BLOOD (ROUTINE X 2)  CULTURE, BLOOD (ROUTINE X 2)  CULTURE, EXPECTORATED SPUTUM-ASSESSMENT  RAPID URINE DRUG SCREEN, HOSP PERFORMED  INFLUENZA PANEL BY PCR (TYPE A & B)  PROCALCITONIN  PROCALCITONIN  LACTIC ACID, PLASMA  LACTIC ACID, PLASMA  BLOOD GAS, ARTERIAL  BASIC METABOLIC PANEL  CBC  MAGNESIUM  PHOSPHORUS  HIV ANTIBODY (ROUTINE TESTING)  URINALYSIS, ROUTINE W REFLEX MICROSCOPIC  I-STAT TROPONIN, ED  I-STAT CG4 LACTIC ACID, ED    EKG  EKG Interpretation None       Radiology Dg Chest Portable 1 View  Result Date: 02/06/2017 CLINICAL DATA:  Shortness of breath. History of radiated right upper lobe cancer. Per epic notes patient is imaged at the [place]. EXAM: PORTABLE CHEST 1 VIEW COMPARISON:  01/15/2017 FINDINGS: Linear opacity about the full right hilum. Patient has history of radiated lung cancer in the right upper lobe. No edema, effusion, or air bronchogram. No pneumothorax. Normal heart size. IMPRESSION: New atelectasis around the patient's right perihilar mass. Patient has history of treated right lung cancer. Please ensure patient has oncology follow-up to ensure this change is not a sign of progression/recurrence. Electronically Signed   By: Monte Fantasia M.D.   On: 02/06/2017 20:34    Procedures Procedures (including critical care time)  Medications Ordered in ED Medications  albuterol (PROVENTIL,VENTOLIN) solution continuous neb (10 mg/hr Nebulization New Bag/Given 02/06/17 2004)  insulin aspart (novoLOG) injection 2-6 Units (not administered)  arformoterol (BROVANA) nebulizer solution 15 mcg (15 mcg Nebulization Given 02/07/17 0017)  ipratropium-albuterol (DUONEB) 0.5-2.5 (3) MG/3ML nebulizer solution 3 mL (not administered)  budesonide  (PULMICORT) nebulizer solution 0.25 mg (0.25 mg Nebulization Given 02/07/17 0017)  pantoprazole (PROTONIX) injection 40 mg (not administered)  heparin injection 5,000 Units (not administered)  naloxone (NARCAN) injection 0.4 mg (0.4 mg Intravenous Given 02/06/17 1949)  naloxone (NARCAN) injection 1 mg (1 mg Intravenous Given 02/06/17 2014)     Initial Impression / Assessment and Plan / ED Course  I have reviewed the triage vital signs and the nursing notes.  Pertinent labs & imaging results that were available during my care of the patient were reviewed by me and considered in my medical decision making (see chart for details).    Slight improvement with Narcan 0.4 then 1mg .  Continued on BiPap and continuous neb. First gas with Resp acidosis and PCO2 72, 2nd gas minimally changed. Pt continues to respond to voice. Discussed with Intensivist.   CRITICAL CARE Performed by: Lolita Patella   Total critical care time: 60 minutes  Critical care time was exclusive of separately billable procedures and treating other patients.  Critical care was necessary to treat or prevent imminent or life-threatening deterioration.  Critical care was time spent personally by me on the following activities: development of treatment plan with patient and/or surrogate as well as nursing, discussions with consultants, evaluation of patient's response to treatment, examination of patient, obtaining history from patient or surrogate, ordering and performing treatments and interventions, ordering and review of laboratory studies, ordering and review of radiographic studies, pulse oximetry and re-evaluation of patient's condition.   Final Clinical Impressions(s) / ED Diagnoses   Final diagnoses:  COPD exacerbation ([place])  Acute respiratory failure with hypercapnia ([place])   No eminent need for intubation or PPV.  ICU team in room.    ED Discharge Orders    None       Tanna Furry, MD 02/07/17 Quentin Mulling      Tanna Furry, MD 02/25/17 [phone]

## 2017-02-07 NOTE — Progress Notes (Signed)
[facility] Progress Note Patient Name: Manuel Baxter DOB: Jan 08, 1956 MRN: [id number]   Date of Service  02/07/2017  HPI/Events of Note  Chronic pain  eICU Interventions  Fentanyl prn     Intervention Category Intermediate Interventions: Pain - evaluation and management  Simonne Maffucci 02/07/2017, 1:22 AM

## 2017-02-07 NOTE — H&P (Signed)
PULMONARY / CRITICAL CARE MEDICINE   Name: Manuel Baxter MRN: [id number] DOB: 1956/01/09    ADMISSION DATE:  02/06/2017 CONSULTATION DATE:  02/07/2017   REFERRING MD:  Dr. 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 Recent admission with respiratory failure secondary to PNA.   On 1/4 patient was take to ER for increasing confusion and lethargy. Family reports that since discharge he has been living with son and daughter-in-law. He is non-compliant with medications and CPAP usage, family with find medications stashed in his pockets and around the house. During the day he is home alone as family members are working. For the last few days he has been less interactive and sleepy. Will only wear CPAP for a couple of minutes before taking it off, tells family it is not working.   Upon arrival to ED, patient is lethargic with stable vital signs. CXR with no acute. ABG 7.272/74/128. LA 1.76. Troponin 0.03. WBC 4.4. Placed on BiPAP. PCCM asked to admit.   PAST MEDICAL HISTORY :  He  has a past medical history of COPD (chronic obstructive pulmonary disease) ([place]) and Lung cancer ([place]).  PAST SURGICAL HISTORY: He  has no past surgical history on file.  Allergies  Allergen Reactions  . Doxepin Anaphylaxis  . Lisinopril Anaphylaxis  . Acetaminophen Other (See Comments)    Elevates liver enzymes,has hx of cirrhosis ( non-alcoholic )  . Contrast Media [Iodinated Diagnostic Agents] Other (See Comments)    Stopped breathing  . Gabapentin Other (See Comments)    Tremors, loopy  . Prozac [Fluoxetine Hcl] Other (See Comments)    Makes him  mean    No current facility-administered medications on file prior to encounter.    Current Outpatient Medications on File Prior to Encounter  Medication Sig  . albuterol (PROAIR HFA) 108 (90 Base) MCG/ACT inhaler Inhale 2 puffs into the lungs every 6 (six) hours as needed for wheezing or shortness of breath.  . ALBUTEROL IN Take 1 vial by nebulization every 4 (four) hours as needed (shortness of breath/wheezing).  . Alum Hydroxide-Mag Carbonate (GAVISCON PO) Take 1 tablet by mouth daily as needed (indigestion/heartburn).  Marland Kitchen aspirin EC 81 MG tablet Take 81 mg by mouth daily.  Marland Kitchen atenolol (TENORMIN) 50 MG tablet Take 50 mg by mouth 2 (two) times daily.  . Cholecalciferol (VITAMIN D) 2000 units tablet Take 2,000 Units by mouth 2 (two) times daily.  . clopidogrel (PLAVIX) 75 MG tablet Take 75 mg by mouth daily with supper.  . cyanocobalamin 500 MCG tablet Take 500 mcg by mouth 2 (two) times daily. Vitamin B12  . DULoxetine (CYMBALTA) 30 MG capsule Take 30 mg by mouth daily.  . Fluticasone Furoate-Vilanterol (BREO ELLIPTA IN) Inhale 1 puff into the lungs daily.  Marland Kitchen glipiZIDE (GLUCOTROL) 5 MG tablet Take 2.5 mg by mouth 2 (two) times daily before a meal.  . loratadine (CLARITIN) 10 MG tablet Take 10 mg by mouth daily.  . montelukast (SINGULAIR) 10 MG tablet Take 10 mg by mouth daily with supper.  . Omega-3 Fatty Acids (FISH OIL) 1000 MG CAPS Take 1,000-2,000 mg by mouth See admin instructions. Take  2 capsules (2000 mg) by mouth every morning and 1 capsule (1000 mg) before supper  . omeprazole (PRILOSEC) 20 MG capsule Take 20 mg by mouth daily.  . OXYGEN Inhale 2.5-3 L into the lungs continuous.  . pregabalin (LYRICA) 75 MG capsule Take 75 mg by mouth 3 (three) times daily as needed (pain).  Marland Kitchen tiotropium (SPIRIVA) 18 MCG inhalation capsule Place 18 mcg into inhaler and inhale every evening.  . triamterene-hydrochlorothiazide (MAXZIDE-25) 37.5-25 MG tablet Take 1 tablet by mouth daily.    FAMILY  HISTORY:  His has no family status information on file.    SOCIAL HISTORY: He    REVIEW OF SYSTEMS:   Unable to review as patient is encephalopathic   SUBJECTIVE:   VITAL SIGNS: BP 133/73   Pulse 89   Temp (!) 97.5 F (36.4 C) (Oral)   Resp 15   SpO2 100%   HEMODYNAMICS:    VENTILATOR SETTINGS: Vent Mode: PCV;BIPAP FiO2 (%):  [40 %] 40 % Set Rate:  [15 bmp] 15 bmp PEEP:  [5 cmH20] 5 cmH20  INTAKE / OUTPUT: No intake/output data recorded.  PHYSICAL EXAMINATION: General:  Adult male, on Bipap  Neuro:  Lethargic, moves extremities, does not follow commands   HEENT:  Dry MM  Cardiovascular:  RRR, no MRG  Lungs:  Exp Wheeze, no crackles, non-labored  Abdomen:  Obese, active bowel sounds  Musculoskeletal:  -edema  Skin:  Warm, dry, intact   LABS:  BMET Recent Labs  Lab 02/06/17 1950  NA 140  K 5.1  CL 96*  CO2 34*  BUN 35*  CREATININE 1.56*  GLUCOSE 162*    Electrolytes Recent Labs  Lab 02/06/17 1950  CALCIUM 9.4    CBC Recent Labs  Lab 02/06/17 1950  WBC 4.4  HGB 12.2*  HCT 40.7  PLT 124*    Coag's No results for input(s): APTT, INR in the last 168 hours.  Sepsis Markers Recent Labs  Lab 02/06/17 2010  LATICACIDVEN 1.76    ABG Recent Labs  Lab 02/06/17 2141 02/06/17 2332  PHART 7.272* 7.303*  PCO2ART 74.0* 63.4*  PO2ART 128.0* 104.0    Liver Enzymes No results for input(s): AST, ALT, ALKPHOS, BILITOT, ALBUMIN in the last 168 hours.  Cardiac Enzymes No results for input(s): TROPONINI, PROBNP in the last 168 hours.  Glucose Recent Labs  Lab 02/06/17 1948  GLUCAP 150*    Imaging Dg Chest Portable 1 View  Result Date: 02/06/2017 CLINICAL DATA:  Shortness of breath. History of radiated right upper lobe cancer. Per epic notes patient is imaged at the [place]. EXAM: PORTABLE CHEST 1 VIEW COMPARISON:  01/15/2017 FINDINGS: Linear opacity about the full right hilum. Patient has history of radiated lung cancer in the right upper  lobe. No edema, effusion, or air bronchogram. No pneumothorax. Normal heart size. IMPRESSION: New atelectasis around the patient's right perihilar mass. Patient has history of treated right lung cancer. Please ensure patient has oncology follow-up to ensure this change is not a sign of progression/recurrence. Electronically Signed   By: Monte Fantasia M.D.   On: 02/06/2017 20:34     STUDIES:  CXR 01/04 > Linear opacity about the full right hilum. Patient has history of radiated lung cancer in the right upper lobe. No edema, effusion, or air bronchogram. No pneumothorax. Normal heart size  CULTURES: Blood 1/5 >> Sputum 1/5 >> RVP 1/5 >> Flu 1/5 >>  U/A 1/5 >>   ANTIBIOTICS: None.   SIGNIFICANT EVENTS: 1/4 > Presents  to ED   LINES/TUBES: PIV   DISCUSSION: 62 year old male presents to ED with lethargy and AMS. ABG with Hypercarbic Respiratory Failure   ASSESSMENT / PLAN:  PULMONARY A: Acute on Chronic Hypercarbic Respiratory Failure On 2-3L home Oxygen  H/O OSA (CPAP), COPD, Tobacco Use, Non-Small cell lung CA s/p Radiation (2014)  P:   BiPAP  Monitor for need of intubation  Trend ABG/CXR > Repeat 0500  Pulmicort, Brovana, Duoneb   CARDIOVASCULAR A:  CAD, (reports MI in 1994, denies stent placement)  H/O HTN, HLD P:  Cardiac Monitoring  ASA and Plavix when able to take PO ECHO pending    RENAL A:   Acute Kidney Injury (Base Crt 0.7-0.9)  P:   Trend BMP Replace electrolytes as indicated   GASTROINTESTINAL A:   GERD Recent ERCP with stent placement  Pancreatic Mass  H/O Cirrhosis  P:   NPO PPI  LFT and Ammonia Pending   HEMATOLOGIC A:   VTE Prop  P:  Trend CBC  SCDs Heparin SQ   INFECTIOUS A:   Recent PNA  P:   Trend WBC and Fever Curve Follow Culture Data Trend PCT and LA  RVP and Flu   ENDOCRINE A:   DM   P:   Trend Glucose  SSI  Hold home glucotrol   NEUROLOGIC A:   Hypercarbic Encephalopathy  H/O CVA, Depression/Anxiety,  Bipolar   P:   Monitor  Continue Lyrica and Cymbalta when able to take PO    FAMILY  - Updates: Family updated at bedside. Would like to continue Full Code Status for now.   - Inter-disciplinary family meet or Palliative Care meeting due by:  02/14/2017    CC Time: 68 minutes   Hayden Pedro, AGACNP-BC [facility]  Pgr: [phone]  PCCM Pgr: [phone]

## 2017-02-07 NOTE — Progress Notes (Signed)
eLink Physician-Brief Progress Note Patient Name: Ronrico Dupin DOB: 1955/07/14 MRN: [id number]   Date of Service  02/07/2017  HPI/Events of Note  hyperglycemia  eICU Interventions  Prn SSI     Intervention Category Major Interventions: Hyperglycemia - active titration of insulin therapy  Simonne Maffucci 02/07/2017, 1:45 AM

## 2017-02-07 NOTE — ED Notes (Signed)
Dr. Lake Bells notified of critical lactic

## 2017-02-08 ENCOUNTER — Inpatient Hospital Stay (HOSPITAL_COMMUNITY): Payer: Medicare Other

## 2017-02-08 DIAGNOSIS — I509 Heart failure, unspecified: Secondary | ICD-10-CM

## 2017-02-08 LAB — PROCALCITONIN

## 2017-02-08 LAB — CBC
HCT: 33.8 % — ABNORMAL LOW (ref 39.0–52.0)
Hemoglobin: 10.2 g/dL — ABNORMAL LOW (ref 13.0–17.0)
MCH: 26.9 pg (ref 26.0–34.0)
MCHC: 30.2 g/dL (ref 30.0–36.0)
MCV: 89.2 fL (ref 78.0–100.0)
PLATELETS: 117 10*3/uL — AB (ref 150–400)
RBC: 3.79 MIL/uL — ABNORMAL LOW (ref 4.22–5.81)
RDW: 17.5 % — AB (ref 11.5–15.5)
WBC: 3.7 10*3/uL — AB (ref 4.0–10.5)

## 2017-02-08 LAB — GLUCOSE, CAPILLARY
GLUCOSE-CAPILLARY: 123 mg/dL — AB (ref 65–99)
GLUCOSE-CAPILLARY: 125 mg/dL — AB (ref 65–99)
GLUCOSE-CAPILLARY: 150 mg/dL — AB (ref 65–99)
GLUCOSE-CAPILLARY: 219 mg/dL — AB (ref 65–99)
GLUCOSE-CAPILLARY: 259 mg/dL — AB (ref 65–99)
Glucose-Capillary: 276 mg/dL — ABNORMAL HIGH (ref 65–99)

## 2017-02-08 LAB — BASIC METABOLIC PANEL
Anion gap: 6 (ref 5–15)
BUN: 35 mg/dL — AB (ref 6–20)
CALCIUM: 9.2 mg/dL (ref 8.9–10.3)
CO2: 38 mmol/L — ABNORMAL HIGH (ref 22–32)
CREATININE: 1.14 mg/dL (ref 0.61–1.24)
Chloride: 92 mmol/L — ABNORMAL LOW (ref 101–111)
GFR calc non Af Amer: >60 mL/min (ref >60)
Glucose, Bld: 138 mg/dL — ABNORMAL HIGH (ref 65–99)
Potassium: 3.7 mmol/L (ref 3.5–5.1)
SODIUM: 136 mmol/L (ref 135–145)

## 2017-02-08 LAB — ECHOCARDIOGRAM COMPLETE
Height: 67 in
WEIGHTICAEL: 3527.36 [oz_av]

## 2017-02-08 MED ORDER — GUAIFENESIN ER 600 MG PO TB12
600.0000 mg | ORAL_TABLET | Freq: Two times a day (BID) | ORAL | Status: DC
  Administered 2017-02-08 – 2017-02-10 (×4): 600 mg via ORAL
  Filled 2017-02-08 (×4): qty 1

## 2017-02-08 MED ORDER — MENTHOL 3 MG MT LOZG: 1.0000 | LOZENGE | OROMUCOSAL | Status: DC | PRN

## 2017-02-08 MED ORDER — ASPIRIN EC 81 MG PO TBEC
81.0000 mg | DELAYED_RELEASE_TABLET | Freq: Every day | ORAL | Status: DC
  Administered 2017-02-08 – 2017-02-10 (×3): 81 mg via ORAL
  Filled 2017-02-08 (×3): qty 1

## 2017-02-08 MED ORDER — TRIAMTERENE-HCTZ 37.5-25 MG PO TABS
1.0000 | ORAL_TABLET | Freq: Every day | ORAL | Status: DC
  Administered 2017-02-08 – 2017-02-10 (×3): 1 via ORAL
  Filled 2017-02-08 (×4): qty 1

## 2017-02-08 MED ORDER — DULOXETINE HCL 30 MG PO CPEP
30.0000 mg | ORAL_CAPSULE | Freq: Every day | ORAL | Status: DC
  Administered 2017-02-08 – 2017-02-10 (×3): 30 mg via ORAL
  Filled 2017-02-08 (×3): qty 1

## 2017-02-08 MED ORDER — ATENOLOL 50 MG PO TABS
50.0000 mg | ORAL_TABLET | Freq: Two times a day (BID) | ORAL | Status: DC
  Administered 2017-02-08 – 2017-02-10 (×5): 50 mg via ORAL
  Filled 2017-02-08 (×5): qty 1

## 2017-02-08 MED ORDER — BENZONATATE 100 MG PO CAPS
100.0000 mg | ORAL_CAPSULE | Freq: Three times a day (TID) | ORAL | Status: DC
  Administered 2017-02-08 – 2017-02-10 (×6): 100 mg via ORAL
  Filled 2017-02-08 (×6): qty 1

## 2017-02-08 MED ORDER — MONTELUKAST SODIUM 10 MG PO TABS
10.0000 mg | ORAL_TABLET | Freq: Every day | ORAL | Status: DC
  Administered 2017-02-08 – 2017-02-09 (×2): 10 mg via ORAL
  Filled 2017-02-08 (×2): qty 1

## 2017-02-08 MED ORDER — CLOPIDOGREL BISULFATE 75 MG PO TABS
75.0000 mg | ORAL_TABLET | Freq: Every day | ORAL | Status: DC
  Administered 2017-02-08 – 2017-02-09 (×2): 75 mg via ORAL
  Filled 2017-02-08 (×2): qty 1

## 2017-02-08 MED ORDER — METHYLPREDNISOLONE SODIUM SUCC 40 MG IJ SOLR
40.0000 mg | Freq: Two times a day (BID) | INTRAMUSCULAR | Status: DC
  Administered 2017-02-08 – 2017-02-09 (×2): 40 mg via INTRAVENOUS
  Filled 2017-02-08 (×2): qty 1

## 2017-02-08 MED ORDER — LORATADINE 10 MG PO TABS
10.0000 mg | ORAL_TABLET | Freq: Every day | ORAL | Status: DC
  Administered 2017-02-08 – 2017-02-10 (×3): 10 mg via ORAL
  Filled 2017-02-08 (×3): qty 1

## 2017-02-08 MED ORDER — PANTOPRAZOLE SODIUM 40 MG PO TBEC
40.0000 mg | DELAYED_RELEASE_TABLET | Freq: Every day | ORAL | Status: DC
  Administered 2017-02-08 – 2017-02-09 (×2): 40 mg via ORAL
  Filled 2017-02-08 (×2): qty 1

## 2017-02-08 NOTE — Progress Notes (Signed)
Pt placed on Auto CPAP (Max 20, min 5) with 2.5L O2 bled in. Pt tolerating at this time. RT will continue to monitor.

## 2017-02-08 NOTE — Progress Notes (Signed)
[facility] Progress Note  Patient: Manuel Baxter VQM:[id number]   [facility] DOB: March 31, 1955   DOA: 02/06/2017   DOS: 02/08/2017   Date of Service: the patient was seen and examined on 02/08/2017  Subjective: shortnes of breath still present, no chest pain, still has some cough  Brief hospital course: Pt. 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 Currently further plan is continue currnet care.  Assessment and Plan: 1.  Acute on chronic respiratory failure. Acute COPD exacerbation due to coronavirus bronchitis. Chronic respiratory failure. Severe COPD. Active smoker. Continue CPAP nightly. Continue oxygen support. Continue Pulmicort Brovana and DuoNeb. Increase Solu-Medrol to 40 mg twice daily. Add Mucinex. Blood cultures and pro-calcitonin level negative. Patient will follow up with pulmonary as an outpatient.  2.  History of non-small cell lung cancer. Neuroendocrine pink reticulocyte mass S/P ERCP. Patient follows up outpatient with Dr. Vonzella Nipple in [place]. Has a PET scan ordered.  3.  Active smoker. Counseled to stop smoking.  4.  Chronic pain syndrome. Continue current care.  Diet: regular diet DVT Prophylaxis: subcutaneous Heparin Advance goals of care discussion: full code  Family Communication: family was present at bedside, at the time of interview. The pt provided permission to discuss medical plan with the family. Opportunity was given to ask question and all questions were answered satisfactorily.   Disposition:  Discharge to home.  Consultants: PCCM primary Procedures: none  Antibiotics: Anti-infectives (From admission, onward)   None       Objective: Physical Exam: Vitals:   02/08/17 0808 02/08/17 0900 02/08/17 1401 02/08/17  1402  BP:  (!) 150/71  (!) 145/70  Pulse:    78  Resp:    16  Temp:    98.2 F (36.8 C)  TempSrc:    Oral  SpO2: 96% 91% 93% 96%  Weight:      Height:        Intake/Output Summary (Last 24 hours) at 02/08/2017 1523 Last data filed at 02/08/2017 1247 Gross per 24 hour  Intake 300 ml  Output 1475 ml  Net -1175 ml   Filed Weights   02/07/17 0300 02/08/17 0440  Weight: 76.1 kg (167 lb 12.3 oz) 100 kg (220 lb 7.4 oz)   General: Alert, Awake and Oriented to Time, Place and Person. Appear in mild distress, affect appropriate Eyes: PERRL, Conjunctiva normal ENT: Oral Mucosa clear moist. Neck: no JVD, no Abnormal Mass Or lumps Cardiovascular: S1 and S2 Present, no Murmur, Peripheral Pulses Present Respiratory: normal respiratory effort, Bilateral Air entry equal and Decreased, no use of accessory muscle, bilateral Crackles, bilateral  wheezes Abdomen: Bowel Sound [present, Soft and no tenderness, no hernia Skin: no redness, no Rash, no induration Extremities: no Pedal edema, no calf tenderness Neurologic: Grossly no focal neuro deficit. Bilaterally Equal motor strength Data Reviewed: CBC: Recent Labs  Lab 02/06/17 1950 02/07/17 0321 02/08/17 0440  WBC 4.4 2.7* 3.7*  HGB 12.2* 11.2* 10.2*  HCT 40.7 36.3* 33.8*  MCV 92.3 90.5 89.2  PLT 124* 120* 932*   Basic Metabolic Panel: Recent Labs  Lab 02/06/17 1950 02/07/17 0321 02/08/17 0440  NA 140 140 136  K 5.1 3.9 3.7  CL 96* 95* 92*  CO2 34* 32 38*  GLUCOSE 162* 247* 138*  BUN 35* 39* 35*  CREATININE 1.56* 1.49*  1.14  CALCIUM 9.4 9.2 9.2  MG  --  2.1  --   PHOS  --  2.9  --     Liver Function Tests: Recent Labs  Lab 02/07/17 0100  AST 36  ALT 40  ALKPHOS 97  BILITOT 0.8  PROT 6.9  ALBUMIN 3.1*   No results for input(s): LIPASE, AMYLASE in the last 168 hours. Recent Labs  Lab 02/07/17 0057  AMMONIA 40*   Coagulation Profile: No results for input(s): INR, PROTIME in the last 168 hours. Cardiac  Enzymes: No results for input(s): CKTOTAL, CKMB, CKMBINDEX, TROPONINI in the last 168 hours. BNP (last 3 results) No results for input(s): PROBNP in the last 8760 hours. CBG: Recent Labs  Lab 02/07/17 2000 02/07/17 2337 02/08/17 0334 02/08/17 0803 02/08/17 1216  GLUCAP 180* 227* 150* 125* 123*   Studies: Dg Chest Port 1 View  Result Date: 02/08/2017 CLINICAL DATA:  Acute respiratory failure with hypoxia, COPD, lung cancer, smoker EXAM: PORTABLE CHEST 1 VIEW COMPARISON:  Portable exam 0649 hours compared 02/06/2017 Correlation CT chest 01/14/2017 FINDINGS: Upper normal heart size. Normal mediastinal contours and pulmonary vascularity. Emphysematous changes with stellate density again identified at the lateral margin of the RIGHT hilum likely reflecting previously identified RIGHT perihilar mass with associated atelectasis. Persistent interstitial prominence at the lower lungs. No new infiltrate, pleural effusion or pneumothorax. Question developing vague nodular density 11 mm diameter versus area of infiltrate or summation artifact at LEFT upper lobe; with no definite pulmonary nodule at this site on most recent CT, favor artifact. Bones demineralized. IMPRESSION: Stellate density at RIGHT perihilar corresponding to known mass with atelectasis/retraction. COPD changes with chronic interstitial prominence at the lower lungs. Question summation artifact versus developing nodule 11 mm diameter at LEFT apex, favor artifact as above. Electronically Signed   By: Lavonia Dana M.D.   On: 02/08/2017 10:10    Scheduled Meds: . arformoterol  15 mcg Nebulization BID  . aspirin EC  81 mg Oral Daily  . atenolol  50 mg Oral BID  . benzonatate  100 mg Oral TID  . budesonide (PULMICORT) nebulizer solution  0.5 mg Nebulization BID  . clopidogrel  75 mg Oral Q supper  . DULoxetine  30 mg Oral Daily  . guaiFENesin  600 mg Oral BID  . heparin  5,000 Units Subcutaneous Q8H  . insulin aspart  0-15 Units  Subcutaneous Q4H  . ipratropium-albuterol  3 mL Nebulization Q6H  . loratadine  10 mg Oral Daily  . mouth rinse  15 mL Mouth Rinse BID  . methylPREDNISolone (SOLU-MEDROL) injection  40 mg Intravenous Q12H  . montelukast  10 mg Oral Q supper  . pantoprazole  40 mg Oral QHS  . pregabalin  75 mg Oral BID  . triamterene-hydrochlorothiazide  1 tablet Oral Daily   Continuous Infusions: PRN Meds: menthol-cetylpyridinium, oxyCODONE  Time spent: 35 minutes  Author: Berle Mull, MD [facility] Pager: [phone] 02/08/2017 3:23 PM  If 7PM-7AM, please contact night-coverage at [email or web address], password [facility]

## 2017-02-08 NOTE — Progress Notes (Signed)
  Echocardiogram 2D Echocardiogram has been performed.  Katie Faraone T Mailyn Steichen 02/08/2017, 1:56 PM

## 2017-02-09 LAB — POCT I-STAT 3, ART BLOOD GAS (G3+)
Acid-Base Excess: 7 mmol/L — ABNORMAL HIGH (ref 0.0–2.0)
BICARBONATE: 35.6 mmol/L — AB (ref 20.0–28.0)
O2 Saturation: 98 %
PCO2 ART: 72.7 mmHg — AB (ref 32.0–48.0)
TCO2: 38 mmol/L — AB (ref 22–32)
pH, Arterial: 7.298 — ABNORMAL LOW (ref 7.350–7.450)
pO2, Arterial: 129 mmHg — ABNORMAL HIGH (ref 83.0–108.0)

## 2017-02-09 LAB — GLUCOSE, CAPILLARY
GLUCOSE-CAPILLARY: 150 mg/dL — AB (ref 65–99)
GLUCOSE-CAPILLARY: 438 mg/dL — AB (ref 65–99)
GLUCOSE-CAPILLARY: 160 mg/dL — AB (ref 65–99)
Glucose-Capillary: 158 mg/dL — ABNORMAL HIGH (ref 65–99)
Glucose-Capillary: 69 mg/dL (ref 65–99)
Glucose-Capillary: 97 mg/dL (ref 65–99)

## 2017-02-09 LAB — CBC WITH DIFFERENTIAL/PLATELET
BASOS PCT: 0 %
Basophils Absolute: 0 10*3/uL (ref 0.0–0.1)
Eosinophils Absolute: 0 10*3/uL (ref 0.0–0.7)
Eosinophils Relative: 0 %
HEMATOCRIT: 37 % — AB (ref 39.0–52.0)
Hemoglobin: 11.6 g/dL — ABNORMAL LOW (ref 13.0–17.0)
Lymphocytes Relative: 16 %
Lymphs Abs: 0.7 10*3/uL (ref 0.7–4.0)
MCH: 28 pg (ref 26.0–34.0)
MCHC: 31.4 g/dL (ref 30.0–36.0)
MCV: 89.4 fL (ref 78.0–100.0)
MONO ABS: 0.4 10*3/uL (ref 0.1–1.0)
Monocytes Relative: 8 %
NEUTROS ABS: 3.5 10*3/uL (ref 1.7–7.7)
Neutrophils Relative %: 76 %
Platelets: 119 10*3/uL — ABNORMAL LOW (ref 150–400)
RBC: 4.14 MIL/uL — AB (ref 4.22–5.81)
RDW: 17.3 % — AB (ref 11.5–15.5)
WBC: 4.6 10*3/uL (ref 4.0–10.5)

## 2017-02-09 LAB — BASIC METABOLIC PANEL
Anion gap: 8 (ref 5–15)
BUN: 30 mg/dL — ABNORMAL HIGH (ref 6–20)
CALCIUM: 9.1 mg/dL (ref 8.9–10.3)
CO2: 32 mmol/L (ref 22–32)
Chloride: 97 mmol/L — ABNORMAL LOW (ref 101–111)
Creatinine, Ser: 1.28 mg/dL — ABNORMAL HIGH (ref 0.61–1.24)
GFR calc Af Amer: >60 mL/min (ref >60)
GFR, EST NON AFRICAN AMERICAN: 58 mL/min — AB (ref >60)
Glucose, Bld: 256 mg/dL — ABNORMAL HIGH (ref 65–99)
Potassium: 4.4 mmol/L (ref 3.5–5.1)
Sodium: 137 mmol/L (ref 135–145)

## 2017-02-09 LAB — EXPECTORATED SPUTUM ASSESSMENT W GRAM STAIN, RFLX TO RESP C

## 2017-02-09 LAB — EXPECTORATED SPUTUM ASSESSMENT W REFEX TO RESP CULTURE

## 2017-02-09 MED ORDER — GUAIFENESIN ER 600 MG PO TB12: 600.0000 mg | ORAL_TABLET | Freq: Two times a day (BID) | ORAL | 0 refills | Status: DC

## 2017-02-09 MED ORDER — PREDNISONE 50 MG PO TABS
50.0000 mg | ORAL_TABLET | Freq: Every day | ORAL | Status: DC
  Administered 2017-02-10: 50 mg via ORAL
  Filled 2017-02-09: qty 1

## 2017-02-09 MED ORDER — OXYCODONE HCL 5 MG PO TABS: 5.0000 mg | ORAL_TABLET | Freq: Two times a day (BID) | ORAL | 0 refills | Status: DC | PRN

## 2017-02-09 MED ORDER — MENTHOL 3 MG MT LOZG: 1.0000 | LOZENGE | OROMUCOSAL | 12 refills | Status: DC | PRN

## 2017-02-09 MED ORDER — INSULIN ASPART 100 UNIT/ML ~~LOC~~ SOLN
0.0000 [IU] | Freq: Three times a day (TID) | SUBCUTANEOUS | Status: DC
  Administered 2017-02-10: 2 [IU] via SUBCUTANEOUS
  Administered 2017-02-10: 3 [IU] via SUBCUTANEOUS

## 2017-02-09 MED ORDER — PREDNISONE 10 MG PO TABS: ORAL_TABLET | ORAL | 0 refills | Status: DC

## 2017-02-09 MED ORDER — BENZONATATE 100 MG PO CAPS: 100.0000 mg | ORAL_CAPSULE | Freq: Three times a day (TID) | ORAL | 0 refills | Status: DC

## 2017-02-09 MED ORDER — INSULIN ASPART 100 UNIT/ML ~~LOC~~ SOLN
20.0000 [IU] | Freq: Once | SUBCUTANEOUS | Status: AC
  Administered 2017-02-09: 20 [IU] via SUBCUTANEOUS

## 2017-02-09 NOTE — Care Management Note (Signed)
Case Management Note  Patient Details  Name: Manuel Baxter MRN: [id number] Date of Birth: April 06, 1955  Subjective/Objective:                    Action/Plan:  Spoke to patient and wife Manuel Baxter at bedside regarding home health , choice offered. They would like [facility]. Patient will be discharging to his son's home : [address], [place], [place] [address] .   He is currently having his home "redone" so he is staying with his son. He may move in with a friend who lives 2 miles away ( he does not have that address). Patient's cell phone is [phone], Angela's is [phone].   Patient has home oxygen through the [place], so oxygen is through [facility] in [place]. Family will bring portable oxygen tank for patient to use on car ride home.    Referral given to [facility], who is aware of all of the above. Expected Discharge Date:                  Expected Discharge Plan:  [place]  In-House Referral:     Discharge planning Services  CM Consult  Post Acute Care Choice:  Home Health Choice offered to:  Spouse, Patient  DME Arranged:  N/A DME Agency:  NA  HH Arranged:  PT HH Agency:  [facility]  Status of Service:  Completed, signed off  If discussed at [place] of Stay Meetings, dates discussed:    Additional Comments:  Marilu Favre, RN 02/09/2017, 11:53 AM

## 2017-02-09 NOTE — Progress Notes (Signed)
   02/09/17 1000  Clinical Encounter Type  Visited With Patient and family together  Visit Type Initial  Referral From Chaplain  Consult/Referral To Chaplain  Spiritual Encounters  Spiritual Needs Prayer  Stress Factors  Patient Stress Factors Exhausted  Family Stress Factors Exhausted     Patient was laying on his bed with oxygen tube in place. Wife on-site . Both were very expressive about thei life's journey and their sustenance being faith in Christ.We all talked about AD/ Living will. I provided the paperwork and they both started filling their details in. They requested to havre time to do that and they'll call chaplain through charge nurse. I also provided emotional support through encouragement and prayer.   Daisy Mcneel a Medical sales representative, [employer]

## 2017-02-09 NOTE — Progress Notes (Signed)
[facility] Progress Note  Patient: Manuel Baxter [id number]:[id number]   [facility] DOB: [birth year]   DOA: 02/06/2017   DOS: 02/09/2017   Date of Service: the patient was seen and examined on 02/09/2017  Subjective: Continues to have shortness of breath as well as back pain.  No nausea no vomiting.  Brief hospital course: Pt. 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 Currently further plan is continue currnet care.  Assessment and Plan: 1.  Acute on chronic respiratory failure. Acute COPD exacerbation due to coronavirus bronchitis. Chronic respiratory failure. Severe COPD. Active smoker. Continue CPAP nightly. Continue oxygen support. Continue Pulmicort Brovana and DuoNeb. Increase Solu-Medrol to 40 mg twice daily. Add Mucinex. Blood cultures and pro-calcitonin level negative. Patient will follow up with pulmonary as an outpatient.  2.  History of non-small cell lung cancer. Neuroendocrine pink reticulocyte mass S/P ERCP. Patient follows up outpatient with Dr. Vonzella Nipple in [place]. Has a PET scan ordered.  3.  Active smoker. Counseled to stop smoking.  4.  Chronic pain syndrome. Continue current care.  Diet: regular diet DVT Prophylaxis: subcutaneous Heparin Advance goals of care discussion: full code  Family Communication: family was present at bedside, at the time of interview. The pt provided permission to discuss medical plan with the family. Opportunity was given to ask question and all questions were answered satisfactorily.   Disposition:  Discharge to home.  Consultants: PCCM primary Procedures: none  Antibiotics: Anti-infectives (From admission, onward)   None       Objective: Physical Exam: Vitals:   02/09/17 0915 02/09/17 0916 02/09/17  1446 02/09/17 1601  BP:   128/61   Pulse:   60   Resp:      Temp:   97.9 F (36.6 C)   TempSrc:   Oral   SpO2: 96% 96% 98% 91%  Weight:      Height:        Intake/Output Summary (Last 24 hours) at 02/09/2017 1800 Last data filed at 02/09/2017 1700 Gross per 24 hour  Intake 660 ml  Output -  Net 660 ml   Filed Weights   02/08/17 0440 02/09/17 0419 02/09/17 0558  Weight: 100 kg (220 lb 7.4 oz) 98.5 kg (217 lb 2.5 oz) 98.5 kg (217 lb 2.5 oz)   General: Alert, Awake and Oriented to Time, Place and Person. Appear in mild distress, affect appropriate Eyes: PERRL, Conjunctiva normal ENT: Oral Mucosa clear moist. Neck: no JVD, no Abnormal Mass Or lumps Cardiovascular: S1 and S2 Present, no Murmur, Peripheral Pulses Present Respiratory: normal respiratory effort, Bilateral Air entry equal and Decreased, no use of accessory muscle, bilateral Crackles, bilateral  wheezes Abdomen: Bowel Sound [present, Soft and no tenderness, no hernia Skin: no redness, no Rash, no induration Extremities: no Pedal edema, no calf tenderness Neurologic: Grossly no focal neuro deficit. Bilaterally Equal motor strength Data Reviewed: CBC: Recent Labs  Lab 02/06/17 1950 02/07/17 0321 02/08/17 0440 02/09/17 0843  WBC 4.4 2.7* 3.7* 4.6  NEUTROABS  --   --   --  3.5  HGB 12.2* 11.2* 10.2* 11.6*  HCT 40.7 36.3* 33.8* 37.0*  MCV 92.3 90.5 89.2 89.4  PLT 124* 120* 117* 536*   Basic Metabolic Panel: Recent Labs  Lab 02/06/17 1950 02/07/17 0321 02/08/17 0440 02/09/17 0843  NA 140 140  136 137  K 5.1 3.9 3.7 4.4  CL 96* 95* 92* 97*  CO2 34* 32 38* 32  GLUCOSE 162* 247* 138* 256*  BUN 35* 39* 35* 30*  CREATININE 1.56* 1.49* 1.14 1.28*  CALCIUM 9.4 9.2 9.2 9.1  MG  --  2.1  --   --   PHOS  --  2.9  --   --     Liver Function Tests: Recent Labs  Lab 02/07/17 0100  AST 36  ALT 40  ALKPHOS 97  BILITOT 0.8  PROT 6.9  ALBUMIN 3.1*   No results for input(s): LIPASE, AMYLASE in the last 168  hours. Recent Labs  Lab 02/07/17 0057  AMMONIA 40*   Coagulation Profile: No results for input(s): INR, PROTIME in the last 168 hours. Cardiac Enzymes: No results for input(s): CKTOTAL, CKMB, CKMBINDEX, TROPONINI in the last 168 hours. BNP (last 3 results) No results for input(s): PROBNP in the last 8760 hours. CBG: Recent Labs  Lab 02/09/17 0359 02/09/17 0823 02/09/17 1204 02/09/17 1547 02/09/17 1721  GLUCAP 150* 158* 438* 69 97   Studies: No results found.  Scheduled Meds: . arformoterol  15 mcg Nebulization BID  . aspirin EC  81 mg Oral Daily  . atenolol  50 mg Oral BID  . benzonatate  100 mg Oral TID  . budesonide (PULMICORT) nebulizer solution  0.5 mg Nebulization BID  . clopidogrel  75 mg Oral Q supper  . DULoxetine  30 mg Oral Daily  . guaiFENesin  600 mg Oral BID  . heparin  5,000 Units Subcutaneous Q8H  . insulin aspart  0-15 Units Subcutaneous TID WC & HS  . ipratropium-albuterol  3 mL Nebulization Q6H  . loratadine  10 mg Oral Daily  . mouth rinse  15 mL Mouth Rinse BID  . montelukast  10 mg Oral Q supper  . pantoprazole  40 mg Oral QHS  . [START ON 02/10/2017] predniSONE  50 mg Oral Q breakfast  . pregabalin  75 mg Oral BID  . triamterene-hydrochlorothiazide  1 tablet Oral Daily   Continuous Infusions: PRN Meds: menthol-cetylpyridinium, oxyCODONE  Time spent: 35 minutes  Author: Berle Mull, MD [facility] Pager: [phone] 02/09/2017 6:00 PM  If 7PM-7AM, please contact night-coverage at [email or web address], password [facility]

## 2017-02-09 NOTE — Evaluation (Signed)
Physical Therapy Evaluation Patient Details Name: Manuel Baxter MRN: [id number] DOB: [birth year] Today's Date: 02/09/2017   History of Present Illness  62yo male admitted with confusion and lethargy, diagnosed with acute on chronic hypercarbic respiratory failure, AKI. PMH COPD, lung cancer, s/p radiation CVA on dual platelets, HTN, OSA, cPAP noncompliance, neuroendocrine pancreatic mass   Clinical Impression   Patient received sitting at EOB with wife present, pleasant and willing to participate in PT this afternoon. Upon examination of mobility he generally appears to be at baseline level of function reported by him and his wife, with main limiting factor being shortness of breath and fatigue with gait. Able to ambulate approximately 40f on O2 with sats 90-91%. Patient does have a history of falls due to back pain/legs buckling as well as reduced functional activity tolerance, and may benefit from skilled HHPT services moving forward. Patient left sitting up at EOB with all needs met, and respiratory therapist present and attending.     Follow Up Recommendations Home health PT    Equipment Recommendations  None recommended by PT    Recommendations for Other Services       Precautions / Restrictions Precautions Precautions: Fall Restrictions Weight Bearing Restrictions: No      Mobility  Bed Mobility               General bed mobility comments: DNT, received sitting up at EOB   Transfers Overall transfer level: Needs assistance Equipment used: Straight cane Transfers: Sit to/from Stand Sit to Stand: Min guard            Ambulation/Gait Ambulation/Gait assistance: Supervision Ambulation Distance (Feet): 40 Feet Assistive device: Straight cane Gait Pattern/deviations: Step-through pattern;Decreased step length - right;Decreased step length - left;Drifts right/left;Trunk flexed     General Gait Details: no significant difficulties noted with balance and steadiness  with gait, gait distance/pattern most limited by SOB   Stairs            Wheelchair Mobility    Modified Rankin (Stroke Patients Only)       Balance Overall balance assessment: Needs assistance;History of Falls Sitting-balance support: Bilateral upper extremity supported;Feet supported Sitting balance-Leahy Scale: Good     Standing balance support: Single extremity supported;During functional activity Standing balance-Leahy Scale: Good                               Pertinent Vitals/Pain Pain Assessment: No/denies pain    Home Living Family/patient expects to be discharged to:: Private residence Living Arrangements: Spouse/significant other;Children Available Help at Discharge: Family Type of Home: Mobile home(patient now living with son in mobile home, will be transitioning to another mobile home soon ) Home Access: Stairs to enter   Entrance Stairs-Number of Steps: 1-2 small steps per wife  Home Layout: One level Home Equipment: WEnvironmental consultant- 2 wheels;Cane - single point;Electric scooter;Wheelchair - manual      Prior Function Level of Independence: Independent with assistive device(s)         Comments: limited by difficulties with breathing at baseline, wife reports house hold distances     Hand Dominance        Extremity/Trunk Assessment   Upper Extremity Assessment Upper Extremity Assessment: Overall WFL for tasks assessed    Lower Extremity Assessment Lower Extremity Assessment: Overall WFL for tasks assessed    Cervical / Trunk Assessment Cervical / Trunk Assessment: Normal  Communication   Communication: No difficulties  Cognition Arousal/Alertness:  Awake/alert Behavior During Therapy: WFL for tasks assessed/performed Overall Cognitive Status: Within Functional Limits for tasks assessed                                        General Comments General comments (skin integrity, edema, etc.): wife and patient report  falls due to back pain/legs buckling; depending on the day, patient may use his cane, walker, or his WC or electric scooter     Exercises     Assessment/Plan    PT Assessment Patient needs continued PT services  PT Problem List Decreased mobility;Decreased safety awareness;Decreased coordination;Decreased balance;Decreased activity tolerance;Pain       PT Treatment Interventions DME instruction;Therapeutic activities;Gait training;Therapeutic exercise;Patient/family education;Stair training;Balance training;Neuromuscular re-education;Functional mobility training    PT Goals (Current goals can be found in the Care Plan section)  Acute Rehab PT Goals Patient Stated Goal: to go home  PT Goal Formulation: With patient/family Time For Goal Achievement: 02/16/17 Potential to Achieve Goals: Good    Frequency Min 2X/week   Barriers to discharge        Co-evaluation               AM-PAC PT "6 Clicks" Daily Activity  Outcome Measure Difficulty turning over in bed (including adjusting bedclothes, sheets and blankets)?: None Difficulty moving from lying on back to sitting on the side of the bed? : None Difficulty sitting down on and standing up from a chair with arms (e.g., wheelchair, bedside commode, etc,.)?: None Help needed moving to and from a bed to chair (including a wheelchair)?: None Help needed walking in hospital room?: None Help needed climbing 3-5 steps with a railing? : A Little 6 Click Score: 23    End of Session Equipment Utilized During Treatment: Gait belt;Oxygen Activity Tolerance: Patient tolerated treatment well Patient left: in bed;with call bell/phone within reach;with family/visitor present;Other (comment)(sitting at EOB, RT present and attending )   PT Visit Diagnosis: History of falling (Z91.81);Other (comment)(reduced functional activity tolerance )    Time: 7282-0601 PT Time Calculation (min) (ACUTE ONLY): 20 min   Charges:   PT Evaluation $PT  Eval Low Complexity: 1 Low     PT G Codes:   PT G-Codes **NOT FOR INPATIENT CLASS** Functional Assessment Tool Used: AM-PAC 6 Clicks Basic Mobility;Clinical judgement    Deniece Ree PT, DPT, CBIS  Supplemental Physical Therapist [place]   Pager [phone]

## 2017-02-10 LAB — GLUCOSE, CAPILLARY
Glucose-Capillary: 132 mg/dL — ABNORMAL HIGH (ref 65–99)
Glucose-Capillary: 187 mg/dL — ABNORMAL HIGH (ref 65–99)

## 2017-02-10 MED ORDER — DOXYCYCLINE HYCLATE 100 MG PO TABS
100.0000 mg | ORAL_TABLET | Freq: Two times a day (BID) | ORAL | Status: DC
  Administered 2017-02-10: 100 mg via ORAL
  Filled 2017-02-10: qty 1

## 2017-02-10 MED ORDER — IPRATROPIUM-ALBUTEROL 0.5-2.5 (3) MG/3ML IN SOLN: 3.0000 mL | Freq: Four times a day (QID) | RESPIRATORY_TRACT | 0 refills | Status: AC

## 2017-02-10 MED ORDER — DOXYCYCLINE HYCLATE 100 MG PO TABS: 100.0000 mg | ORAL_TABLET | Freq: Two times a day (BID) | ORAL | 0 refills | Status: DC

## 2017-02-10 NOTE — Care Management Important Message (Signed)
Important Message  Patient Details  Name: Manuel Baxter MRN: [id number] Date of Birth: 07-Apr-1955   Medicare Important Message Given:  Yes    Jaspreet Bodner Montine Circle 02/10/2017, 1:01 PM

## 2017-02-10 NOTE — Progress Notes (Signed)
Manuel Baxter to be D/C'd  per MD order. Discussed with the patient and all questions fully answered.  VSS, Skin clean, dry and intact without evidence of skin break down, no evidence of skin tears noted.  IV catheter discontinued intact. Site without signs and symptoms of complications. Dressing and pressure applied.  An After Visit Summary was printed and given to the patient. Patient received prescription.  D/c education completed with patient/family including follow up instructions, medication list, d/c activities limitations if indicated, with other d/c instructions as indicated by MD - patient able to verbalize understanding, all questions fully answered.   Patient instructed to return to ED, call 911, or call MD for any changes in condition.   Patient to be escorted via [place], and D/C home via private auto.

## 2017-02-10 NOTE — Consult Note (Addendum)
[facility] CM Primary Care Navigator  02/10/2017  Manuel Baxter 09-Apr-1955 [id number]   Met with patient at the bedside to identify possible discharge needs. Patient reports having difficulty breathing and was found by son to have "passed out"whichhadled to this admission.  Patient endorses that Dr. Lamont Snowball with [facility] in [place] as his primary care provider.   [facility] [address] [facility] [address] [facility] without difficulty so far.  Patient reports thatwife Manuel Baxter) has been managing his medications at home with use of "pill box" system filled every 2 weeks.  Patient mentioned he was driving prior to admission but wife and sons Manuel Baxter and Manuel Baxter) will providetransportation to his doctors'appointments after discharge.  Patient states he has been living with son Manuel Baxter) and daughter-in-law Manuel Baxter) at their house until his and wife's house will be "cleaned up" and redone. His son and daughter-in-law are his primary caregivers as stated.  Anticipated plan for discharge is homewith home health servicesper therapy recommendation.  Patientvoiced understanding to call primary care provider's officewhenhereturns home,for a post discharge follow-upappointmentwithin 1-2 weeks or sooner if needs arise.Patient letter (with PCP's contact number) was provided as areminder.  Discussed withpatientregardingTHN CM services available for health managementat home.Explained tohim about COPD action plan as well.  Patient expressed preference for calls instead of home visits.   Patient voiced that he would onlyopt andverbally agreetoEMMICOPD callsfor follow-up of his recovery until he gets situated (on living situation).   [facility] COPD[facility] after discharge.  Patient voicedunderstanding of needto seekreferral from primary care provider to [facility] care management if deemed  necessaryand appropriate for further services in the future.  [facility] care management information provided for future needs thatpatient may have.  Patient is awaiting for chaplain to be able to make his advance directives prior to going home. RN aware.   For additional questions please contact:  Edwena Felty A. Djimon Lundstrom, BSN, RN-BC [facility] PRIMARY CARE Navigator Cell: [phone]

## 2017-02-11 DIAGNOSIS — M6281 Muscle weakness (generalized): Secondary | ICD-10-CM | POA: Diagnosis not present

## 2017-02-11 DIAGNOSIS — I2510 Atherosclerotic heart disease of native coronary artery without angina pectoris: Secondary | ICD-10-CM | POA: Diagnosis not present

## 2017-02-11 DIAGNOSIS — J9622 Acute and chronic respiratory failure with hypercapnia: Secondary | ICD-10-CM | POA: Diagnosis not present

## 2017-02-11 DIAGNOSIS — E119 Type 2 diabetes mellitus without complications: Secondary | ICD-10-CM | POA: Diagnosis not present

## 2017-02-11 DIAGNOSIS — J449 Chronic obstructive pulmonary disease, unspecified: Secondary | ICD-10-CM | POA: Diagnosis not present

## 2017-02-11 DIAGNOSIS — C3490 Malignant neoplasm of unspecified part of unspecified bronchus or lung: Secondary | ICD-10-CM | POA: Diagnosis not present

## 2017-02-11 LAB — CULTURE, RESPIRATORY: CULTURE: NORMAL

## 2017-02-11 LAB — CULTURE, RESPIRATORY W GRAM STAIN

## 2017-02-11 NOTE — Discharge Summary (Signed)
[facility] Discharge Summary   Patient: Manuel Baxter DVV:[id number]   PCP: [facility] DOB: January 17, 1956   Date of admission: 02/06/2017   Date of discharge: 02/10/2017     Discharge Diagnoses:  Active Problems:   Acute on chronic respiratory failure ([place])   Admitted From: home Disposition:  Home with home health  Recommendations for Outpatient Follow-up:  1. Please follow up with PCP in 1 week   [place]. Schedule an appointment as soon as possible for a visit in 1 week(s).   Specialty:  General Practice Contact information: [address] [place] [address] [phone]        [facility], [facility] Follow up.   Specialty:  Home Health Services Why:  Provide home health PT Contact information: [place]  [phone]          Diet recommendation: cardiac diet  Activity: The patient is advised to gradually reintroduce usual activities.  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 Recent admission with respiratory failure secondary to PNA.   On 1/4 patient was take to ER for increasing confusion and lethargy. Family reports that since discharge he has been living with son and daughter-in-law. He is non-compliant with medications and CPAP usage, family with find medications stashed in his pockets and around the house. During the day he is home alone as family members are working. For the last few days he has been less interactive and sleepy. Will only wear CPAP for a couple of minutes before taking it off, tells family it is not working.    Upon arrival to ED, patient is lethargic with stable vital signs. CXR with no acute. ABG 7.272/74/128. LA 1.76. Troponin 0.03. WBC 4.4. Placed on BiPAP. PCCM asked to admit. "  Hospital Course:  Summary of his active problems in the hospital is as following. 1.  Acute on chronic respiratory failure. Acute COPD exacerbation due to coronavirus bronchitis. Chronic respiratory failure. Severe COPD. Active smoker. Continue CPAP nightly. Continue oxygen support. Continue Pulmicort Brovana and DuoNeb. Continue steroids, add doxycycline Add Mucinex. Blood cultures and pro-calcitonin level negative. Patient will follow up with pulmonary as an outpatient.  2.  History of non-small cell lung cancer. Neuroendocrine pink reticulocyte mass S/P ERCP. Patient follows up outpatient with Dr. Vonzella Nipple in [place]. Has a PET scan ordered.  3.  Active smoker. Counseled to stop smoking.  4.  Chronic pain syndrome. Continue current care.  All other chronic medical condition were stable during the hospitalization.  Patient was seen by physical therapy, who recommended home health, which was arranged by Education officer, museum and case Freight forwarder. On the day of the discharge the patient's vitals were stable, and no other acute medical condition were reported by patient. the patient was felt safe to be discharge at home with hom health.  Procedures and Results:  none   Consultations:  PCCM   DISCHARGE MEDICATION: Allergies as of 02/10/2017      Reactions   Doxepin Anaphylaxis   Lisinopril Anaphylaxis   Acetaminophen Other (See Comments)   Elevates liver enzymes,has hx of cirrhosis ( non-alcoholic )  Contrast Media [iodinated Diagnostic Agents] Other (See Comments)   Stopped breathing   Gabapentin Other (See Comments)   Tremors, loopy   Prozac [fluoxetine Hcl] Other (See Comments)   Makes him mean      Medication List    TAKE these medications   aspirin EC 81 MG tablet Take 81 mg by mouth  daily.   atenolol 50 MG tablet Commonly known as:  TENORMIN Take 50 mg by mouth 2 (two) times daily.   benzonatate 100 MG capsule Commonly known as:  TESSALON Take 1 capsule (100 mg total) by mouth 3 (three) times daily.   BREO ELLIPTA 100-25 MCG/INH Aepb Generic drug:  fluticasone furoate-vilanterol Inhale 1 puff into the lungs daily.   clopidogrel 75 MG tablet Commonly known as:  PLAVIX Take 75 mg by mouth daily with supper.   cyanocobalamin 500 MCG tablet Take 500 mcg by mouth 2 (two) times daily. Vitamin B12   doxycycline 100 MG tablet Commonly known as:  VIBRA-TABS Take 1 tablet (100 mg total) by mouth every 12 (twelve) hours.   DULoxetine 30 MG capsule Commonly known as:  CYMBALTA Take 30 mg by mouth daily.   Fish Oil 1000 MG Caps Take 1,000-2,000 mg by mouth See admin instructions. Take 2 capsules (2000 mg) by mouth every morning and 1 capsule (1000 mg) before supper   GAVISCON PO Take 1 tablet by mouth daily as needed (indigestion/heartburn).   glipiZIDE 5 MG tablet Commonly known as:  GLUCOTROL Take 2.5 mg by mouth 2 (two) times daily before a meal.   guaiFENesin 600 MG 12 hr tablet Commonly known as:  MUCINEX Take 1 tablet (600 mg total) by mouth 2 (two) times daily.   ipratropium-albuterol 0.5-2.5 (3) MG/3ML Soln Commonly known as:  DUONEB Take 3 mLs by nebulization every 6 (six) hours.   loratadine 10 MG tablet Commonly known as:  CLARITIN Take 10 mg by mouth daily.   menthol-cetylpyridinium 3 MG lozenge Commonly known as:  CEPACOL Take 1 lozenge (3 mg total) by mouth as needed for sore throat.   montelukast 10 MG tablet Commonly known as:  SINGULAIR Take 10 mg by mouth daily with supper.   omeprazole 20 MG capsule Commonly known as:  PRILOSEC Take 20 mg by mouth daily.   oxyCODONE 5 MG immediate release tablet Commonly known as:  Oxy IR/ROXICODONE Take 1 tablet (5 mg total) by mouth 2 (two) times daily as needed for moderate pain.     OXYGEN Inhale 2.5-3 L into the lungs continuous.   predniSONE 10 MG tablet Commonly known as:  DELTASONE Take 50mg  daily for 3days,Take 40mg  daily for 3days,Take 30mg  daily for 3days,Take 20mg  daily for 3days,Take 10mg  daily for 3days, then stop.   pregabalin 75 MG capsule Commonly known as:  LYRICA Take 75 mg by mouth 3 (three) times daily as needed (pain).   PROAIR HFA 108 (90 Base) MCG/ACT inhaler Generic drug:  albuterol Inhale 2 puffs into the lungs every 6 (six) hours as needed for wheezing or shortness of breath.   albuterol (2.5 MG/3ML) 0.083% nebulizer solution Commonly known as:  PROVENTIL Take 2.5 mg by nebulization every 4 (four) hours as needed for wheezing or shortness of breath.   tiotropium 18 MCG inhalation capsule Commonly known as:  SPIRIVA Place 18 mcg into inhaler and inhale every evening.   triamterene-hydrochlorothiazide 37.5-25 MG tablet Commonly known as:  MAXZIDE-25 Take 1 tablet by mouth daily.   Vitamin D 2000 units tablet Take 2,000 Units by  mouth 2 (two) times daily.      Allergies  Allergen Reactions  . Doxepin Anaphylaxis  . Lisinopril Anaphylaxis  . Acetaminophen Other (See Comments)    Elevates liver enzymes,has hx of cirrhosis ( non-alcoholic )  . Contrast Media [Iodinated Diagnostic Agents] Other (See Comments)    Stopped breathing  . Gabapentin Other (See Comments)    Tremors, loopy  . Prozac [Fluoxetine Hcl] Other (See Comments)    Makes him mean    Discharge Exam: Filed Weights   02/09/17 0419 02/09/17 0558 02/10/17 0625  Weight: 98.5 kg (217 lb 2.5 oz) 98.5 kg (217 lb 2.5 oz) 98.7 kg (217 lb 9.5 oz)   Vitals:   02/10/17 0801 02/10/17 0802  BP:    Pulse:    Resp:    Temp:    SpO2: 95% 95%   General: Appear in mild distress, no Rash; Oral Mucosa moist. Cardiovascular: S1 and S2 Present, no Murmur, no JVD Respiratory: Bilateral Air entry present and no Crackles, Occasional  wheezes Abdomen: Bowel Sound present, Soft  and no tenderness Extremities: no Pedal edema, no calf tenderness Neurology: Grossly no focal neuro deficit.  The results of significant diagnostics from this hospitalization (including imaging, microbiology, ancillary and laboratory) are listed below for reference.    Significant Diagnostic Studies: Dg Chest Port 1 View  Result Date: 02/08/2017 CLINICAL DATA:  Acute respiratory failure with hypoxia, COPD, lung cancer, smoker EXAM: PORTABLE CHEST 1 VIEW COMPARISON:  Portable exam 0649 hours compared 02/06/2017 Correlation CT chest 01/14/2017 FINDINGS: Upper normal heart size. Normal mediastinal contours and pulmonary vascularity. Emphysematous changes with stellate density again identified at the lateral margin of the RIGHT hilum likely reflecting previously identified RIGHT perihilar mass with associated atelectasis. Persistent interstitial prominence at the lower lungs. No new infiltrate, pleural effusion or pneumothorax. Question developing vague nodular density 11 mm diameter versus area of infiltrate or summation artifact at LEFT upper lobe; with no definite pulmonary nodule at this site on most recent CT, favor artifact. Bones demineralized. IMPRESSION: Stellate density at RIGHT perihilar corresponding to known mass with atelectasis/retraction. COPD changes with chronic interstitial prominence at the lower lungs. Question summation artifact versus developing nodule 11 mm diameter at LEFT apex, favor artifact as above. Electronically Signed   By: Lavonia Dana M.D.   On: 02/08/2017 10:10   Dg Chest Portable 1 View  Result Date: 02/06/2017 CLINICAL DATA:  Shortness of breath. History of radiated right upper lobe cancer. Per epic notes patient is imaged at the [place]. EXAM: PORTABLE CHEST 1 VIEW COMPARISON:  01/15/2017 FINDINGS: Linear opacity about the full right hilum. Patient has history of radiated lung cancer in the right upper lobe. No edema, effusion, or air bronchogram. No pneumothorax. Normal heart  size. IMPRESSION: New atelectasis around the patient's right perihilar mass. Patient has history of treated right lung cancer. Please ensure patient has oncology follow-up to ensure this change is not a sign of progression/recurrence. Electronically Signed   By: Monte Fantasia M.D.   On: 02/06/2017 20:34    Microbiology: Recent Results (from the past 240 hour(s))  Culture, blood (routine x 2)     Status: None (Preliminary result)   Collection Time: 02/07/17 12:50 AM  Result Value Ref Range Status   Specimen Description BLOOD RIGHT ANTECUBITAL  Final   Special Requests   Final    BOTTLES DRAWN AEROBIC AND ANAEROBIC Blood Culture adequate volume   Culture NO GROWTH 3 DAYS  Final   Report Status PENDING  Incomplete  Culture, blood (routine x 2)     Status: None (Preliminary result)   Collection Time: 02/07/17 12:57 AM  Result Value Ref Range Status   Specimen Description BLOOD RIGHT HAND  Final   Special Requests   Final    BOTTLES DRAWN AEROBIC ONLY Blood Culture adequate volume   Culture NO GROWTH 3 DAYS  Final   Report Status PENDING  Incomplete  MRSA PCR Screening     Status: None   Collection Time: 02/07/17  3:09 AM  Result Value Ref Range Status   MRSA by PCR NEGATIVE NEGATIVE Final    Comment:        The GeneXpert MRSA Assay (FDA approved for NASAL specimens only), is one component of a comprehensive MRSA colonization surveillance program. It is not intended to diagnose MRSA infection nor to guide or monitor treatment for MRSA infections.   Respiratory Panel by PCR     Status: Abnormal   Collection Time: 02/07/17  4:02 AM  Result Value Ref Range Status   Adenovirus NOT DETECTED NOT DETECTED Final   Coronavirus 229E NOT DETECTED NOT DETECTED Final   Coronavirus HKU1 NOT DETECTED NOT DETECTED Final   Coronavirus NL63 NOT DETECTED NOT DETECTED Final   Coronavirus OC43 DETECTED (A) NOT DETECTED Final   Metapneumovirus NOT DETECTED NOT DETECTED Final   Rhinovirus /  Enterovirus NOT DETECTED NOT DETECTED Final   Influenza A NOT DETECTED NOT DETECTED Final   Influenza B NOT DETECTED NOT DETECTED Final   Parainfluenza Virus 1 NOT DETECTED NOT DETECTED Final   Parainfluenza Virus 2 NOT DETECTED NOT DETECTED Final   Parainfluenza Virus 3 NOT DETECTED NOT DETECTED Final   Parainfluenza Virus 4 NOT DETECTED NOT DETECTED Final   Respiratory Syncytial Virus NOT DETECTED NOT DETECTED Final   Bordetella pertussis NOT DETECTED NOT DETECTED Final   Chlamydophila pneumoniae NOT DETECTED NOT DETECTED Final   Mycoplasma pneumoniae NOT DETECTED NOT DETECTED Final  Culture, expectorated sputum-assessment     Status: None   Collection Time: 02/09/17 12:25 PM  Result Value Ref Range Status   Specimen Description SPUTUM  Final   Special Requests NONE  Final   Sputum evaluation THIS SPECIMEN IS ACCEPTABLE FOR SPUTUM CULTURE  Final   Report Status 02/09/2017 FINAL  Final  Culture, respiratory (NON-Expectorated)     Status: None (Preliminary result)   Collection Time: 02/09/17 12:25 PM  Result Value Ref Range Status   Specimen Description SPUTUM  Final   Special Requests NONE Reflexed from [id number]  Final   Gram Stain   Final    ABUNDANT WBC PRESENT, PREDOMINANTLY PMN RARE SQUAMOUS EPITHELIAL CELLS PRESENT ABUNDANT GRAM POSITIVE RODS RARE BUDDING YEAST SEEN    Culture CULTURE REINCUBATED FOR BETTER GROWTH  Final   Report Status PENDING  Incomplete     Labs: CBC: Recent Labs  Lab 02/06/17 1950 02/07/17 0321 02/08/17 0440 02/09/17 0843  WBC 4.4 2.7* 3.7* 4.6  NEUTROABS  --   --   --  3.5  HGB 12.2* 11.2* 10.2* 11.6*  HCT 40.7 36.3* 33.8* 37.0*  MCV 92.3 90.5 89.2 89.4  PLT 124* 120* 117* 623*   Basic Metabolic Panel: Recent Labs  Lab 02/06/17 1950 02/07/17 0321 02/08/17 0440 02/09/17 0843  NA 140 140 136 137  K 5.1 3.9 3.7 4.4  CL 96* 95* 92* 97*  CO2 34* 32 38* 32  GLUCOSE 162* 247* 138* 256*  BUN 35* 39* 35* 30*  CREATININE 1.56* 1.49* 1.14  1.28*  CALCIUM 9.4 9.2 9.2 9.1  MG  --  2.1  --   --   PHOS  --  2.9  --   --    Liver Function Tests: Recent Labs  Lab 02/07/17 0100  AST 36  ALT 40  ALKPHOS 97  BILITOT 0.8  PROT 6.9  ALBUMIN 3.1*   No results for input(s): LIPASE, AMYLASE in the last 168 hours. Recent Labs  Lab 02/07/17 0057  AMMONIA 40*   Cardiac Enzymes: No results for input(s): CKTOTAL, CKMB, CKMBINDEX, TROPONINI in the last 168 hours. BNP (last 3 results) No results for input(s): BNP in the last 8760 hours. CBG: Recent Labs  Lab 02/09/17 1547 02/09/17 1721 02/09/17 2126 02/10/17 0810 02/10/17 1200  GLUCAP 69 97 160* 132* 187*   Time spent: 35 minutes  Signed:  Berle Mull  [facility] 02/10/2017 , 8:59 AM

## 2017-02-12 LAB — CULTURE, BLOOD (ROUTINE X 2)
Culture: NO GROWTH
Culture: NO GROWTH
SPECIAL REQUESTS: ADEQUATE
Special Requests: ADEQUATE

## 2017-02-23 NOTE — Progress Notes (Signed)
This encounter was created in error - please disregard.

## 2017-02-23 NOTE — Addendum Note (Signed)
Addended by: Virgel Manifold on: 02/23/2017 01:54 PM   Modules accepted: Level of Service, SmartSet

## 2017-03-05 DIAGNOSIS — M6281 Muscle weakness (generalized): Secondary | ICD-10-CM | POA: Diagnosis not present

## 2017-03-05 DIAGNOSIS — J9622 Acute and chronic respiratory failure with hypercapnia: Secondary | ICD-10-CM | POA: Diagnosis not present

## 2017-03-05 DIAGNOSIS — J449 Chronic obstructive pulmonary disease, unspecified: Secondary | ICD-10-CM | POA: Diagnosis not present

## 2017-03-05 DIAGNOSIS — I2510 Atherosclerotic heart disease of native coronary artery without angina pectoris: Secondary | ICD-10-CM | POA: Diagnosis not present

## 2017-03-05 DIAGNOSIS — E119 Type 2 diabetes mellitus without complications: Secondary | ICD-10-CM | POA: Diagnosis not present

## 2017-03-05 DIAGNOSIS — C3490 Malignant neoplasm of unspecified part of unspecified bronchus or lung: Secondary | ICD-10-CM | POA: Diagnosis not present

## 2017-04-24 ENCOUNTER — Encounter (HOSPITAL_COMMUNITY): Payer: Self-pay | Admitting: *Deleted

## 2017-04-24 ENCOUNTER — Inpatient Hospital Stay (HOSPITAL_COMMUNITY)
Admission: EM | Admit: 2017-04-24 | Discharge: 2017-04-28 | DRG: 189 | Disposition: A | Discharge status: Home / Self Care | Payer: Medicare Other | Attending: Nephrology | Admitting: Nephrology

## 2017-04-24 ENCOUNTER — Other Ambulatory Visit: Payer: Self-pay

## 2017-04-24 ENCOUNTER — Emergency Department (HOSPITAL_COMMUNITY): Payer: Medicare Other

## 2017-04-24 DIAGNOSIS — I5033 Acute on chronic diastolic (congestive) heart failure: Secondary | ICD-10-CM | POA: Diagnosis present

## 2017-04-24 DIAGNOSIS — J441 Chronic obstructive pulmonary disease with (acute) exacerbation: Secondary | ICD-10-CM | POA: Diagnosis not present

## 2017-04-24 DIAGNOSIS — G4733 Obstructive sleep apnea (adult) (pediatric): Secondary | ICD-10-CM | POA: Diagnosis present

## 2017-04-24 DIAGNOSIS — R079 Chest pain, unspecified: Secondary | ICD-10-CM | POA: Diagnosis not present

## 2017-04-24 DIAGNOSIS — J440 Chronic obstructive pulmonary disease with acute lower respiratory infection: Secondary | ICD-10-CM | POA: Diagnosis present

## 2017-04-24 DIAGNOSIS — E876 Hypokalemia: Secondary | ICD-10-CM | POA: Diagnosis present

## 2017-04-24 DIAGNOSIS — R0602 Shortness of breath: Secondary | ICD-10-CM | POA: Diagnosis not present

## 2017-04-24 DIAGNOSIS — E119 Type 2 diabetes mellitus without complications: Secondary | ICD-10-CM | POA: Diagnosis not present

## 2017-04-24 DIAGNOSIS — J209 Acute bronchitis, unspecified: Secondary | ICD-10-CM | POA: Diagnosis present

## 2017-04-24 DIAGNOSIS — Z7982 Long term (current) use of aspirin: Secondary | ICD-10-CM

## 2017-04-24 DIAGNOSIS — I110 Hypertensive heart disease with heart failure: Secondary | ICD-10-CM | POA: Diagnosis present

## 2017-04-24 DIAGNOSIS — Z7902 Long term (current) use of antithrombotics/antiplatelets: Secondary | ICD-10-CM | POA: Diagnosis not present

## 2017-04-24 DIAGNOSIS — I10 Essential (primary) hypertension: Secondary | ICD-10-CM | POA: Diagnosis present

## 2017-04-24 DIAGNOSIS — E871 Hypo-osmolality and hyponatremia: Secondary | ICD-10-CM | POA: Diagnosis present

## 2017-04-24 DIAGNOSIS — Z79899 Other long term (current) drug therapy: Secondary | ICD-10-CM

## 2017-04-24 DIAGNOSIS — F17200 Nicotine dependence, unspecified, uncomplicated: Secondary | ICD-10-CM | POA: Diagnosis present

## 2017-04-24 DIAGNOSIS — D649 Anemia, unspecified: Secondary | ICD-10-CM | POA: Diagnosis present

## 2017-04-24 DIAGNOSIS — L0292 Furuncle, unspecified: Secondary | ICD-10-CM | POA: Diagnosis present

## 2017-04-24 DIAGNOSIS — J9601 Acute respiratory failure with hypoxia: Secondary | ICD-10-CM | POA: Diagnosis present

## 2017-04-24 DIAGNOSIS — Z9981 Dependence on supplemental oxygen: Secondary | ICD-10-CM

## 2017-04-24 DIAGNOSIS — Z85118 Personal history of other malignant neoplasm of bronchus and lung: Secondary | ICD-10-CM

## 2017-04-24 DIAGNOSIS — J439 Emphysema, unspecified: Secondary | ICD-10-CM | POA: Diagnosis not present

## 2017-04-24 DIAGNOSIS — M7989 Other specified soft tissue disorders: Secondary | ICD-10-CM | POA: Diagnosis not present

## 2017-04-24 DIAGNOSIS — J9621 Acute and chronic respiratory failure with hypoxia: Principal | ICD-10-CM | POA: Diagnosis present

## 2017-04-24 DIAGNOSIS — R251 Tremor, unspecified: Secondary | ICD-10-CM | POA: Diagnosis not present

## 2017-04-24 LAB — HEPATIC FUNCTION PANEL
ALK PHOS: 117 U/L (ref 38–126)
ALT: 17 U/L (ref 17–63)
AST: 27 U/L (ref 15–41)
Albumin: 3.1 g/dL — ABNORMAL LOW (ref 3.5–5.0)
BILIRUBIN DIRECT: 0.2 mg/dL (ref 0.1–0.5)
BILIRUBIN INDIRECT: 0.2 mg/dL — AB (ref 0.3–0.9)
TOTAL PROTEIN: 6.9 g/dL (ref 6.5–8.1)
Total Bilirubin: 0.4 mg/dL (ref 0.3–1.2)

## 2017-04-24 LAB — I-STAT CHEM 8, ED
BUN: 10 mg/dL (ref 6–20)
CALCIUM ION: 1.14 mmol/L — AB (ref 1.15–1.40)
CREATININE: 0.9 mg/dL (ref 0.61–1.24)
Chloride: 89 mmol/L — ABNORMAL LOW (ref 101–111)
Glucose, Bld: 134 mg/dL — ABNORMAL HIGH (ref 65–99)
HCT: 32 % — ABNORMAL LOW (ref 39.0–52.0)
Hemoglobin: 10.9 g/dL — ABNORMAL LOW (ref 13.0–17.0)
Potassium: 3.9 mmol/L (ref 3.5–5.1)
Sodium: 136 mmol/L (ref 135–145)
TCO2: 42 mmol/L — AB (ref 22–32)

## 2017-04-24 LAB — CBC WITH DIFFERENTIAL/PLATELET
Basophils Absolute: 0 10*3/uL (ref 0.0–0.1)
Basophils Relative: 0 %
EOS ABS: 0.1 10*3/uL (ref 0.0–0.7)
EOS PCT: 2 %
HCT: 33.1 % — ABNORMAL LOW (ref 39.0–52.0)
HEMOGLOBIN: 9.4 g/dL — AB (ref 13.0–17.0)
LYMPHS ABS: 1.5 10*3/uL (ref 0.7–4.0)
Lymphocytes Relative: 19 %
MCH: 25.3 pg — ABNORMAL LOW (ref 26.0–34.0)
MCHC: 28.4 g/dL — ABNORMAL LOW (ref 30.0–36.0)
MCV: 89.2 fL (ref 78.0–100.0)
Monocytes Absolute: 0.9 10*3/uL (ref 0.1–1.0)
Monocytes Relative: 12 %
Neutro Abs: 5.1 10*3/uL (ref 1.7–7.7)
Neutrophils Relative %: 67 %
PLATELETS: 222 10*3/uL (ref 150–400)
RBC: 3.71 MIL/uL — ABNORMAL LOW (ref 4.22–5.81)
RDW: 15.5 % (ref 11.5–15.5)
WBC: 7.6 10*3/uL (ref 4.0–10.5)

## 2017-04-24 LAB — I-STAT TROPONIN, ED: TROPONIN I, POC: 0.35 ng/mL — AB (ref 0.00–0.08)

## 2017-04-24 LAB — BRAIN NATRIURETIC PEPTIDE: B Natriuretic Peptide: 155.4 pg/mL — ABNORMAL HIGH (ref 0.0–100.0)

## 2017-04-24 MED ORDER — BUDESONIDE 0.25 MG/2ML IN SUSP
0.2500 mg | Freq: Two times a day (BID) | RESPIRATORY_TRACT | Status: DC
  Administered 2017-04-25 – 2017-04-28 (×7): 0.25 mg via RESPIRATORY_TRACT
  Filled 2017-04-24 (×7): qty 2

## 2017-04-24 MED ORDER — ALBUTEROL SULFATE (2.5 MG/3ML) 0.083% IN NEBU: 2.5000 mg | INHALATION_SOLUTION | RESPIRATORY_TRACT | Status: DC

## 2017-04-24 MED ORDER — GLIPIZIDE 2.5 MG HALF TABLET
2.5000 mg | ORAL_TABLET | Freq: Two times a day (BID) | ORAL | Status: DC
  Administered 2017-04-25 – 2017-04-28 (×7): 2.5 mg via ORAL
  Filled 2017-04-24 (×8): qty 1

## 2017-04-24 MED ORDER — ONDANSETRON HCL 4 MG/2ML IJ SOLN: 4.0000 mg | Freq: Four times a day (QID) | INTRAMUSCULAR | Status: DC | PRN

## 2017-04-24 MED ORDER — METHYLPREDNISOLONE SODIUM SUCC 40 MG IJ SOLR
40.0000 mg | Freq: Two times a day (BID) | INTRAMUSCULAR | Status: DC
  Administered 2017-04-25 – 2017-04-27 (×5): 40 mg via INTRAVENOUS
  Filled 2017-04-24 (×5): qty 1

## 2017-04-24 MED ORDER — HYDROMORPHONE HCL 2 MG PO TABS
4.0000 mg | ORAL_TABLET | Freq: Four times a day (QID) | ORAL | Status: DC | PRN
  Administered 2017-04-25 – 2017-04-28 (×11): 4 mg via ORAL
  Filled 2017-04-24 (×11): qty 2

## 2017-04-24 MED ORDER — TRIAMTERENE-HCTZ 37.5-25 MG PO TABS
1.0000 | ORAL_TABLET | Freq: Every day | ORAL | Status: DC
  Administered 2017-04-25: 1 via ORAL
  Filled 2017-04-24: qty 1

## 2017-04-24 MED ORDER — METHYLPREDNISOLONE SODIUM SUCC 125 MG IJ SOLR
125.0000 mg | Freq: Once | INTRAMUSCULAR | Status: AC
  Administered 2017-04-24: 125 mg via INTRAVENOUS
  Filled 2017-04-24: qty 2

## 2017-04-24 MED ORDER — IPRATROPIUM BROMIDE 0.02 % IN SOLN: 0.5000 mg | RESPIRATORY_TRACT | Status: DC

## 2017-04-24 MED ORDER — MONTELUKAST SODIUM 10 MG PO TABS
10.0000 mg | ORAL_TABLET | Freq: Every day | ORAL | Status: DC
  Administered 2017-04-25 – 2017-04-27 (×3): 10 mg via ORAL
  Filled 2017-04-24 (×3): qty 1

## 2017-04-24 MED ORDER — MAGNESIUM SULFATE 2 GM/50ML IV SOLN
2.0000 g | Freq: Once | INTRAVENOUS | Status: AC
  Administered 2017-04-24: 2 g via INTRAVENOUS
  Filled 2017-04-24: qty 50

## 2017-04-24 MED ORDER — ENOXAPARIN SODIUM 40 MG/0.4ML ~~LOC~~ SOLN
40.0000 mg | SUBCUTANEOUS | Status: DC
  Administered 2017-04-25 – 2017-04-28 (×4): 40 mg via SUBCUTANEOUS
  Filled 2017-04-24 (×5): qty 0.4

## 2017-04-24 MED ORDER — ALBUTEROL SULFATE (2.5 MG/3ML) 0.083% IN NEBU
2.5000 mg | INHALATION_SOLUTION | RESPIRATORY_TRACT | Status: DC | PRN
  Administered 2017-04-25 – 2017-04-28 (×4): 2.5 mg via RESPIRATORY_TRACT
  Filled 2017-04-24 (×4): qty 3

## 2017-04-24 MED ORDER — LORATADINE 10 MG PO TABS
10.0000 mg | ORAL_TABLET | Freq: Every day | ORAL | Status: DC
  Administered 2017-04-25 – 2017-04-28 (×4): 10 mg via ORAL
  Filled 2017-04-24 (×4): qty 1

## 2017-04-24 MED ORDER — DULOXETINE HCL 30 MG PO CPEP
30.0000 mg | ORAL_CAPSULE | Freq: Every day | ORAL | Status: DC
  Administered 2017-04-25 – 2017-04-28 (×4): 30 mg via ORAL
  Filled 2017-04-24 (×4): qty 1

## 2017-04-24 MED ORDER — ATENOLOL 25 MG PO TABS
50.0000 mg | ORAL_TABLET | Freq: Two times a day (BID) | ORAL | Status: DC
  Administered 2017-04-25 – 2017-04-28 (×7): 50 mg via ORAL
  Filled 2017-04-24 (×7): qty 2

## 2017-04-24 MED ORDER — INSULIN ASPART 100 UNIT/ML ~~LOC~~ SOLN
0.0000 [IU] | Freq: Three times a day (TID) | SUBCUTANEOUS | Status: DC
  Administered 2017-04-25: 2 [IU] via SUBCUTANEOUS
  Administered 2017-04-25: 1 [IU] via SUBCUTANEOUS
  Administered 2017-04-25 – 2017-04-27 (×4): 3 [IU] via SUBCUTANEOUS
  Administered 2017-04-27: 2 [IU] via SUBCUTANEOUS

## 2017-04-24 MED ORDER — CYANOCOBALAMIN 500 MCG PO TABS
500.0000 ug | ORAL_TABLET | Freq: Two times a day (BID) | ORAL | Status: DC
  Administered 2017-04-25 – 2017-04-28 (×7): 500 ug via ORAL
  Filled 2017-04-24 (×7): qty 1

## 2017-04-24 MED ORDER — CLOPIDOGREL BISULFATE 75 MG PO TABS
75.0000 mg | ORAL_TABLET | Freq: Every day | ORAL | Status: DC
  Administered 2017-04-25 – 2017-04-27 (×3): 75 mg via ORAL
  Filled 2017-04-24 (×3): qty 1

## 2017-04-24 MED ORDER — FLUTICASONE PROPIONATE 50 MCG/ACT NA SUSP
1.0000 | Freq: Every day | NASAL | Status: DC
  Administered 2017-04-25 – 2017-04-28 (×4): 1 via NASAL
  Filled 2017-04-24: qty 16

## 2017-04-24 MED ORDER — OMEGA-3-ACID ETHYL ESTERS 1 G PO CAPS
1.0000 g | ORAL_CAPSULE | Freq: Every day | ORAL | Status: DC
  Administered 2017-04-25 – 2017-04-28 (×4): 1 g via ORAL
  Filled 2017-04-24 (×4): qty 1

## 2017-04-24 MED ORDER — FUROSEMIDE 10 MG/ML IJ SOLN
20.0000 mg | Freq: Once | INTRAMUSCULAR | Status: AC
  Administered 2017-04-24: 20 mg via INTRAVENOUS
  Filled 2017-04-24: qty 2

## 2017-04-24 MED ORDER — ASPIRIN EC 81 MG PO TBEC
81.0000 mg | DELAYED_RELEASE_TABLET | Freq: Every day | ORAL | Status: DC
  Administered 2017-04-25 – 2017-04-28 (×4): 81 mg via ORAL
  Filled 2017-04-24 (×4): qty 1

## 2017-04-24 MED ORDER — ONDANSETRON HCL 4 MG PO TABS: 4.0000 mg | ORAL_TABLET | Freq: Four times a day (QID) | ORAL | Status: DC | PRN

## 2017-04-24 MED ORDER — PANTOPRAZOLE SODIUM 40 MG PO TBEC
40.0000 mg | DELAYED_RELEASE_TABLET | Freq: Every day | ORAL | Status: DC
  Administered 2017-04-25 – 2017-04-28 (×4): 40 mg via ORAL
  Filled 2017-04-24 (×4): qty 1

## 2017-04-24 NOTE — ED Notes (Signed)
Condom Cath placed on pt.

## 2017-04-24 NOTE — H&P (Signed)
History and Physical    Manuel Baxter BSJ:[id number] DOB: 07/26/1955 DOA: 04/24/2017  PCP: [place] [facility]  Patient coming from: Home.  Chief Complaint: Shortness of breath.  HPI: Manuel Baxter is a 62 y.o. male with history of COPD with ongoing tobacco abuse, CVA, hypertension, sleep apnea was brought to the ER after patient started developing shortness of breath since this morning.  Over the last 2 days patient also has been having some cough.  Denies any chest pain.  Denies any fever or chills.  Patient has been noticing increasing swelling in his right lower extremity over the last few days.  ED Course: In the ER chest x-ray does not show anything acute.  BNP was around 155.4 and troponin was mildly positive at 1.35.  Patient on exam was wheezing and was placed on nebulizer treatment and steroids.  At the time of my exam patient's wheezing is improved but still mildly short of breath and respiratory pattern is concerning for CHF.  Admitted for respiratory failure likely from a combination of COPD and CHF.  Review of Systems: As per HPI, rest all negative.   Past Medical History:  Diagnosis Date  . COPD (chronic obstructive pulmonary disease) ([place])   . Lung cancer [facility])     History reviewed. No pertinent surgical history.   reports that he has been smoking.  He has never used smokeless tobacco. He reports that he does not drink alcohol or use drugs.  Allergies  Allergen Reactions  . Doxepin Anaphylaxis  . Lisinopril Anaphylaxis  . Acetaminophen Other (See Comments)    Elevates liver enzymes,has hx of cirrhosis ( non-alcoholic )  . Contrast Media [Iodinated Diagnostic Agents] Other (See Comments)    Stopped breathing  . Gabapentin Other (See Comments)    Tremors, loopy  . Lyrica [Pregabalin] Other (See Comments)    nightmares  . Prozac [Fluoxetine Hcl] Other (See Comments)    Makes him mean    Family History  Problem Relation Age of Onset  . Hypertension Other      Prior to Admission medications   Medication Sig Start Date End Date Taking? Authorizing Provider  albuterol (PROAIR HFA) 108 (90 Base) MCG/ACT inhaler Inhale 2 puffs into the lungs every 6 (six) hours as needed for wheezing or shortness of breath.   Yes [provider]  Alum Hydroxide-Mag Carbonate (GAVISCON PO) Take 1 tablet by mouth daily as needed (indigestion/heartburn).   Yes [provider]  aspirin EC 81 MG tablet Take 81 mg by mouth daily.   Yes [provider]  Aspirin-Caffeine (BC FAST PAIN RELIEF ARTHRITIS) 1000-65 MG PACK Take 2 packets by mouth every 4 (four) hours as needed (pain/headache).   Yes [provider]  atenolol (TENORMIN) 50 MG tablet Take 50 mg by mouth 2 (two) times daily.   Yes [provider]  Cholecalciferol (VITAMIN D) 2000 units tablet Take 2,000 Units by mouth 2 (two) times daily.   Yes [provider]  clopidogrel (PLAVIX) 75 MG tablet Take 75 mg by mouth daily with supper.   Yes [provider]  cyanocobalamin 500 MCG tablet Take 500 mcg by mouth 2 (two) times daily. Vitamin B12   Yes [provider]  DULoxetine (CYMBALTA) 30 MG capsule Take 30 mg by mouth daily.   Yes [provider]  fluticasone (FLONASE) 50 MCG/ACT nasal spray Place 1 spray into both nostrils daily.   Yes [provider]  glipiZIDE (GLUCOTROL) 5 MG tablet Take 2.5 mg  by mouth 2 (two) times daily before a meal.   Yes [provider]  HYDROmorphone (DILAUDID) 4 MG tablet Take 4 mg by mouth every 6 (six) hours as needed (chronic pain).   Yes [provider]  ipratropium-albuterol (DUONEB) 0.5-2.5 (3) MG/3ML SOLN Take 3 mLs by nebulization every 6 (six) hours. 02/10/17  Yes Lavina Hamman, MD  loratadine (CLARITIN) 10 MG tablet Take 10 mg by mouth daily.   Yes [provider]  menthol-cetylpyridinium (CEPACOL) 3 MG lozenge Take 1 lozenge (3 mg total) by mouth as needed for sore  throat. Patient taking differently: Take 1 lozenge by mouth 3 (three) times daily as needed for sore throat.  02/09/17  Yes Lavina Hamman, MD  montelukast (SINGULAIR) 10 MG tablet Take 10 mg by mouth daily with supper.   Yes [provider]  Omega-3 Fatty Acids (FISH OIL) 1000 MG CAPS Take 1,000-2,000 mg by mouth See admin instructions. Take 2 capsules (2000 mg) by mouth every morning and 1 capsule (1000 mg) before supper   Yes [provider]  omeprazole (PRILOSEC) 20 MG capsule Take 20 mg by mouth daily.   Yes [provider]  OVER THE COUNTER MEDICATION Place 0.5-1 mLs under the tongue See admin instructions. [employer] - place 1/2 dropperful (0.5 ml) under the tongue twice during the day, place 1 dropperful (1 ml) under the tongue at bedtime. 1 ml - 250 mg hemp, 405 mg linoleic acid, 135 mg Alpha Linoleic Acid, 90 m Oleic Acid.   Yes [provider]  OXYGEN Inhale 2.5-3 L into the lungs continuous.   Yes [provider]  phenylephrine (SUDAFED PE) 10 MG TABS tablet Take 10 mg by mouth every 4 (four) hours as needed (congestion).   Yes [provider]  tiotropium (SPIRIVA) 18 MCG inhalation capsule Place 18 mcg into inhaler and inhale daily.    Yes [provider]  triamterene-hydrochlorothiazide (MAXZIDE-25) 37.5-25 MG tablet Take 1 tablet by mouth daily.   Yes [provider]  benzonatate (TESSALON) 100 MG capsule Take 1 capsule (100 mg total) by mouth 3 (three) times daily. Patient not taking: Reported on 04/24/2017 02/09/17   Lavina Hamman, MD  guaiFENesin (MUCINEX) 600 MG 12 hr tablet Take 1 tablet (600 mg total) by mouth 2 (two) times daily. Patient not taking: Reported on 04/24/2017 02/09/17   Lavina Hamman, MD  oxyCODONE (OXY IR/ROXICODONE) 5 MG immediate release tablet Take 1 tablet (5 mg total) by mouth 2 (two) times daily as needed for moderate pain. Patient not taking: Reported on 04/24/2017 02/09/17    Lavina Hamman, MD    Physical Exam: Vitals:   04/24/17 2025 04/24/17 2100 04/24/17 2200 04/24/17 2300  BP:  132/74 (!) 163/93 (!) 168/97  Pulse:  75 (!) 105 96  Resp:  (!) 22 (!) 30 (!) 35  Temp:      TempSrc:      SpO2: 100% 95% 97% 97%  Weight:      Height:          Constitutional: Moderately built and nourished. Vitals:   04/24/17 2025 04/24/17 2100 04/24/17 2200 04/24/17 2300  BP:  132/74 (!) 163/93 (!) 168/97  Pulse:  75 (!) 105 96  Resp:  (!) 22 (!) 30 (!) 35  Temp:      TempSrc:      SpO2: 100% 95% 97% 97%  Weight:      Height:  Eyes: Anicteric no pallor. ENMT: No discharge from the ears eyes nose or mouth. Neck: No mass felt.  JVD mildly elevated. Respiratory: Mild expiratory wheezes no crepitations. Cardiovascular: S1-S2 heard no murmurs appreciated. Abdomen: Soft nontender bowel sounds present. Musculoskeletal: Mild edema in the right lower extremity. Skin: Skin changes of the right lower extremity probably from boils. Neurologic: Alert awake oriented to time place and person.  Moves all extremities. Psychiatric: Appears normal.  Normal affect.   Labs on Admission: I have personally reviewed following labs and imaging studies  CBC: Recent Labs  Lab 04/24/17 1915 04/24/17 1926  WBC 7.6  --   NEUTROABS 5.1  --   HGB 9.4* 10.9*  HCT 33.1* 32.0*  MCV 89.2  --   PLT 222  --    Basic Metabolic Panel: Recent Labs  Lab 04/24/17 1926  NA 136  K 3.9  CL 89*  GLUCOSE 134*  BUN 10  CREATININE 0.90   GFR: Estimated Creatinine Clearance: 79.6 mL/min (by C-G formula based on SCr of 0.9 mg/dL). Liver Function Tests: Recent Labs  Lab 04/24/17 1915  AST 27  ALT 17  ALKPHOS 117  BILITOT 0.4  PROT 6.9  ALBUMIN 3.1*   No results for input(s): LIPASE, AMYLASE in the last 168 hours. No results for input(s): AMMONIA in the last 168 hours. Coagulation Profile: No results for input(s): INR, PROTIME in the last 168 hours. Cardiac Enzymes: No  results for input(s): CKTOTAL, CKMB, CKMBINDEX, TROPONINI in the last 168 hours. BNP (last 3 results) No results for input(s): PROBNP in the last 8760 hours. HbA1C: No results for input(s): HGBA1C in the last 72 hours. CBG: No results for input(s): GLUCAP in the last 168 hours. Lipid Profile: No results for input(s): CHOL, HDL, LDLCALC, TRIG, CHOLHDL, LDLDIRECT in the last 72 hours. Thyroid Function Tests: No results for input(s): TSH, T4TOTAL, FREET4, T3FREE, THYROIDAB in the last 72 hours. Anemia Panel: No results for input(s): VITAMINB12, FOLATE, FERRITIN, TIBC, IRON, RETICCTPCT in the last 72 hours. Urine analysis:    Component Value Date/Time   COLORURINE YELLOW 02/07/2017 [place] 02/07/2017 0744   LABSPEC 1.017 02/07/2017 0744   PHURINE 5.0 02/07/2017 0744   GLUCOSEU 50 (A) 02/07/2017 0744   HGBUR NEGATIVE 02/07/2017 [place] NEGATIVE 02/07/2017 0744   KETONESUR NEGATIVE 02/07/2017 0744   PROTEINUR NEGATIVE 02/07/2017 0744   NITRITE NEGATIVE 02/07/2017 0744   LEUKOCYTESUR NEGATIVE 02/07/2017 0744   Sepsis Labs: @LABRCNTIP (procalcitonin:4,lacticidven:4) )No results found for this or any previous visit (from the past 240 hour(s)).   Radiological Exams on Admission: Dg Chest Port 1 View  Result Date: 04/24/2017 CLINICAL DATA:  Dyspnea EXAM: PORTABLE CHEST 1 VIEW COMPARISON:  Chest CT 01/14/2017, CXR 02/08/2017 FINDINGS: Stable heart size and mediastinal contours. Spiculated masslike opacity in the right perihilar region is redemonstrated with interval development of platelike atelectasis along its periphery. Platelike atelectasis is slightly more prominent along the subpleural aspect of the left lower lung as well. Emphysematous hyperinflation of the lungs is otherwise noted without acute pneumonic consolidation, effusion or pulmonary edema. No pneumothorax. No aggressive osseous abnormality. IMPRESSION: Emphysematous lungs with redemonstration of  spiculated right perihilar masslike opacity. Bilateral midlung platelike atelectasis is demonstrated, new since prior. No pneumonia or CHF. Electronically Signed   By: Ashley Royalty M.D.   On: 04/24/2017 19:40    EKG: Independently reviewed.  Sinus rhythm with old anteroseptal infarct.  Assessment/Plan Principal Problem:   Acute respiratory failure with hypoxia (HCC)  Active Problems:   COPD exacerbation (HCC)   Diabetes mellitus type 2 in nonobese [facility])   Essential hypertension   Normochromic normocytic anemia    1. Acute respiratory failure with hypoxia -likely a combination of possible CHF with COPD exacerbation.  I have continued patient on duo nebs Pulmicort IV steroids and will place patient on doxycycline since patient has productive cough.  I have also ordered 1 dose of Lasix 20 mg and based on response we will have further doses.  2D echo done in January 2019 3 months ago was showing EF of 55-60% with grade 1 diastolic dysfunction.  We will also check d-dimer.  We will also check Dopplers of the lower extremity since patient has lower extremity edema. 2. Elevated troponin -denies any chest pain.  We will cycle cardiac markers check d-dimer.  Patient is on aspirin Plavix and beta-blocker. 3. Diabetes mellitus type 2 -patient is on Glucotrol.  Will place patient on sliding scale coverage in addition.  Closely follow CBGs since patient is on IV steroids. 4. Hypertension on diuretics and atenolol.  If patient is going to be placed on scheduled dose of Lasix can discontinue Maxzide. 5. History of sleep apnea on CPAP. 6. Boils in the lower extremity on doxycycline. 7. History of non-small cell lung cancer being followed in [place]. 8. Anemia appears to be chronic follow CBC.   DVT prophylaxis: Lovenox. Code Status: Full code. Family Communication: Patient's family. Disposition Plan: Home. Consults called: None. Admission status: Inpatient.   Rise Patience MD [facility] Pager [phone].  If 7PM-7AM, please contact night-coverage [email or web address] Password [facility]  04/24/2017, 11:45 PM

## 2017-04-24 NOTE — ED Triage Notes (Signed)
The pt arrived from [place] [facility] from home.  The pt is c/o  Difficulty breathing since this am with a productive cough  He reports a temp none now  He saw his doctor last Friday  He has a severe cough and  Inspiratory and expiratory wheezes  Rhonchi rt sided  hhn on the way here from [place]  Alert skin warm and dry  The pt keeps both his eyes closed

## 2017-04-24 NOTE — ED Provider Notes (Signed)
[facility] EMERGENCY DEPARTMENT Provider Note   CSN: [id number] Arrival date & time: 04/24/17  1823     History   Chief Complaint Chief Complaint  Patient presents with  . Respiratory Distress    HPI Manuel Baxter is a 62 y.o. male.  Patient complains of shortness of breath.  He also felt like he had a shaking episode.  Patient has a history of COPD lung cancer coronary artery disease  The history is provided by the patient.  Shortness of Breath  This is a recurrent problem. The problem occurs frequently.The current episode started 3 to 5 hours ago. The problem has been gradually improving. Pertinent negatives include no fever, no headaches, no cough, no chest pain, no abdominal pain and no rash. It is unknown what precipitated the problem. Risk factors include smoking. He has tried ipratropium inhalers for the symptoms. He has had prior hospitalizations. He has had prior ED visits. He has had prior ICU admissions. Associated medical issues include COPD.    Past Medical History:  Diagnosis Date  . COPD (chronic obstructive pulmonary disease) ([place])   . Lung cancer [facility])     Patient Active Problem List   Diagnosis Date Noted  . Acute respiratory failure with hypercapnia ([place])   . COPD exacerbation ([place])   . Acute on chronic respiratory failure ([place]) 02/06/2017    History reviewed. No pertinent surgical history.      Home Medications    Prior to Admission medications   Medication Sig Start Date End Date Taking? Authorizing Provider  albuterol (PROAIR HFA) 108 (90 Base) MCG/ACT inhaler Inhale 2 puffs into the lungs every 6 (six) hours as needed for wheezing or shortness of breath.    [provider]  albuterol (PROVENTIL) (2.5 MG/3ML) 0.083% nebulizer solution Take 2.5 mg by nebulization every 4 (four) hours as needed for wheezing or shortness of breath.    [provider]  Alum Hydroxide-Mag Carbonate (GAVISCON PO) Take 1 tablet by  mouth daily as needed (indigestion/heartburn).    [provider]  aspirin EC 81 MG tablet Take 81 mg by mouth daily.    [provider]  atenolol (TENORMIN) 50 MG tablet Take 50 mg by mouth 2 (two) times daily.    [provider]  benzonatate (TESSALON) 100 MG capsule Take 1 capsule (100 mg total) by mouth 3 (three) times daily. 02/09/17   Lavina Hamman, MD  Cholecalciferol (VITAMIN D) 2000 units tablet Take 2,000 Units by mouth 2 (two) times daily.    [provider]  clopidogrel (PLAVIX) 75 MG tablet Take 75 mg by mouth daily with supper.    [provider]  cyanocobalamin 500 MCG tablet Take 500 mcg by mouth 2 (two) times daily. Vitamin B12    [provider]  doxycycline (VIBRA-TABS) 100 MG tablet Take 1 tablet (100 mg total) by mouth every 12 (twelve) hours. 02/10/17   Lavina Hamman, MD  DULoxetine (CYMBALTA) 30 MG capsule Take 30 mg by mouth daily.    [provider]  fluticasone furoate-vilanterol (BREO ELLIPTA) 100-25 MCG/INH AEPB Inhale 1 puff into the lungs daily.    [provider]  glipiZIDE (GLUCOTROL) 5 MG tablet Take 2.5 mg by mouth 2 (two) times daily before a meal.    [provider]  guaiFENesin (MUCINEX) 600 MG 12 hr tablet Take 1 tablet (600 mg total) by mouth 2 (two) times daily. 02/09/17   Lavina Hamman, MD  ipratropium-albuterol (DUONEB) 0.5-2.5 (  3) MG/3ML SOLN Take 3 mLs by nebulization every 6 (six) hours. 02/10/17   Lavina Hamman, MD  loratadine (CLARITIN) 10 MG tablet Take 10 mg by mouth daily.    [provider]  menthol-cetylpyridinium (CEPACOL) 3 MG lozenge Take 1 lozenge (3 mg total) by mouth as needed for sore throat. 02/09/17   Lavina Hamman, MD  montelukast (SINGULAIR) 10 MG tablet Take 10 mg by mouth daily with supper.    [provider]  Omega-3 Fatty Acids (FISH OIL) 1000 MG CAPS Take 1,000-2,000 mg by mouth See admin instructions. Take 2 capsules (2000 mg) by mouth  every morning and 1 capsule (1000 mg) before supper    [provider]  omeprazole (PRILOSEC) 20 MG capsule Take 20 mg by mouth daily.    [provider]  oxyCODONE (OXY IR/ROXICODONE) 5 MG immediate release tablet Take 1 tablet (5 mg total) by mouth 2 (two) times daily as needed for moderate pain. 02/09/17   Lavina Hamman, MD  OXYGEN Inhale 2.5-3 L into the lungs continuous.    [provider]  predniSONE (DELTASONE) 10 MG tablet Take 50mg  daily for 3days,Take 40mg  daily for 3days,Take 30mg  daily for 3days,Take 20mg  daily for 3days,Take 10mg  daily for 3days, then stop. 02/09/17   Lavina Hamman, MD  pregabalin (LYRICA) 75 MG capsule Take 75 mg by mouth 3 (three) times daily as needed (pain).    [provider]  tiotropium (SPIRIVA) 18 MCG inhalation capsule Place 18 mcg into inhaler and inhale every evening.    [provider]  triamterene-hydrochlorothiazide (MAXZIDE-25) 37.5-25 MG tablet Take 1 tablet by mouth daily.    [provider]    Family History No family history on file.  Social History Social History   Tobacco Use  . Smoking status: Current Every Day Smoker  . Smokeless tobacco: Never Used  Substance Use Topics  . Alcohol use: No    Frequency: Never  . Drug use: No     Allergies   Doxepin; Lisinopril; Acetaminophen; Contrast media [iodinated diagnostic agents]; Gabapentin; and Prozac [fluoxetine hcl]   Review of Systems Review of Systems  Constitutional: Negative for appetite change, fatigue and fever.  HENT: Negative for congestion, ear discharge and sinus pressure.   Eyes: Negative for discharge.  Respiratory: Positive for shortness of breath. Negative for cough.   Cardiovascular: Negative for chest pain.  Gastrointestinal: Negative for abdominal pain and diarrhea.  Genitourinary: Negative for frequency and hematuria.  Musculoskeletal: Negative for back pain.  Skin: Negative for rash.  Neurological: Negative  for seizures and headaches.  Psychiatric/Behavioral: Negative for hallucinations.     Physical Exam Updated Vital Signs BP (!) 116/58   Pulse (!) 117   Temp 98.5 F (36.9 C) (Oral)   Resp (!) 21   Ht 5\' 7"  (1.702 m)   Wt 76.2 kg (168 lb)   SpO2 100%   BMI 26.31 kg/m   Physical Exam  Constitutional: He is oriented to person, place, and time. He appears well-developed.  HENT:  Head: Normocephalic.  Eyes: Conjunctivae and EOM are normal. No scleral icterus.  Neck: Neck supple. No thyromegaly present.  Cardiovascular: Normal rate and regular rhythm. Exam reveals no gallop and no friction rub.  No murmur heard. Pulmonary/Chest: No stridor. He has wheezes. He has no rales. He exhibits no tenderness.  Abdominal: He exhibits no distension. There is no tenderness. There is no rebound.  Musculoskeletal: Normal range of motion. He exhibits no edema.  Lymphadenopathy:    He has no cervical adenopathy.  Neurological: He is oriented to person, place, and time. He exhibits normal muscle tone. Coordination normal.  Skin: No rash noted. No erythema.  Psychiatric: He has a normal mood and affect. His behavior is normal.     ED Treatments / Results  Labs (all labs ordered are listed, but only abnormal results are displayed) Labs Reviewed  BRAIN NATRIURETIC PEPTIDE - Abnormal; Notable for the following components:      Result Value   B Natriuretic Peptide 155.4 (*)    All other components within normal limits  CBC WITH DIFFERENTIAL/PLATELET - Abnormal; Notable for the following components:   RBC 3.71 (*)    Hemoglobin 9.4 (*)    HCT 33.1 (*)    MCH 25.3 (*)    MCHC 28.4 (*)    All other components within normal limits  HEPATIC FUNCTION PANEL - Abnormal; Notable for the following components:   Albumin 3.1 (*)    Indirect Bilirubin 0.2 (*)    All other components within normal limits  I-STAT TROPONIN, ED - Abnormal; Notable for the following components:   Troponin i, poc 0.35 (*)      All other components within normal limits  I-STAT CHEM 8, ED - Abnormal; Notable for the following components:   Chloride 89 (*)    Glucose, Bld 134 (*)    Calcium, Ion 1.14 (*)    TCO2 42 (*)    Hemoglobin 10.9 (*)    HCT 32.0 (*)    All other components within normal limits    EKG EKG Interpretation  Date/Time:  Friday April 24 2017 18:31:40 EDT Ventricular Rate:  89 PR Interval:    QRS Duration: 87 QT Interval:  359 QTC Calculation: 437 R Axis:   84 Text Interpretation:  Sinus rhythm Borderline right axis deviation Probable anteroseptal infarct, old Confirmed by Milton Ferguson [phone]) on 04/24/2017 7:46:52 PM   Radiology Dg Chest Port 1 View  Result Date: 04/24/2017 CLINICAL DATA:  Dyspnea EXAM: PORTABLE CHEST 1 VIEW COMPARISON:  Chest CT 01/14/2017, CXR 02/08/2017 FINDINGS: Stable heart size and mediastinal contours. Spiculated masslike opacity in the right perihilar region is redemonstrated with interval development of platelike atelectasis along its periphery. Platelike atelectasis is slightly more prominent along the subpleural aspect of the left lower lung as well. Emphysematous hyperinflation of the lungs is otherwise noted without acute pneumonic consolidation, effusion or pulmonary edema. No pneumothorax. No aggressive osseous abnormality. IMPRESSION: Emphysematous lungs with redemonstration of spiculated right perihilar masslike opacity. Bilateral midlung platelike atelectasis is demonstrated, new since prior. No pneumonia or CHF. Electronically Signed   By: Ashley Royalty M.D.   On: 04/24/2017 19:40    Procedures Procedures (including critical care time)  Medications Ordered in ED Medications  methylPREDNISolone sodium succinate (SOLU-MEDROL) 125 mg/2 mL injection 125 mg (125 mg Intravenous Given 04/24/17 1925)  magnesium sulfate IVPB 2 g 50 mL (0 g Intravenous Stopped 04/24/17 2013)  CRITICAL CARE Performed by: Milton Ferguson Total critical care time: 40  minutes Critical care time was exclusive of separately billable procedures and treating other patients. Critical care was necessary to treat or prevent imminent or life-threatening deterioration. Critical care was time spent personally by me on the following activities: development of treatment plan with patient and/or surrogate as well as nursing, discussions with consultants, evaluation of patient's response to treatment, examination of patient, obtaining history from patient or surrogate, ordering and performing treatments and interventions, ordering and review of  laboratory studies, ordering and review of radiographic studies, pulse oximetry and re-evaluation of patient's condition.    Initial Impression / Assessment and Plan / ED Course  I have reviewed the triage vital signs and the nursing notes.  Pertinent labs & imaging results that were available during my care of the patient were reviewed by me and considered in my medical decision making (see chart for details).   Patient with exacerbation of his COPD along with an elevated troponin.  He will be admitted to the hospital for evaluation of the elevated troponin along with treatment for exacerbation of COPD   Final Clinical Impressions(s) / ED Diagnoses   Final diagnoses:  COPD exacerbation [facility])    ED Discharge Orders    None       Milton Ferguson, MD 04/24/17 2031

## 2017-04-24 NOTE — Progress Notes (Signed)
Pt. sats were not holding up and family stated the the cannula was bothering the pt. Family also stated that the pt. Had some bleeding from his nose. RT placed pt. On aerosol mask with humidity. Pt. sats currently in the high 90"s.

## 2017-04-24 NOTE — ED Notes (Signed)
Report from [place], [place].  Pt awaiting bed assignment.

## 2017-04-24 NOTE — ED Notes (Signed)
Admitting doctor at the bedside 

## 2017-04-25 ENCOUNTER — Other Ambulatory Visit: Payer: Self-pay

## 2017-04-25 ENCOUNTER — Inpatient Hospital Stay (HOSPITAL_COMMUNITY): Payer: Medicare Other

## 2017-04-25 DIAGNOSIS — M7989 Other specified soft tissue disorders: Secondary | ICD-10-CM

## 2017-04-25 LAB — CBC
HCT: 31.6 % — ABNORMAL LOW (ref 39.0–52.0)
HEMATOCRIT: 34.1 % — AB (ref 39.0–52.0)
HEMOGLOBIN: 10 g/dL — AB (ref 13.0–17.0)
HEMOGLOBIN: 9.1 g/dL — AB (ref 13.0–17.0)
MCH: 25.2 pg — ABNORMAL LOW (ref 26.0–34.0)
MCH: 26 pg (ref 26.0–34.0)
MCHC: 28.8 g/dL — ABNORMAL LOW (ref 30.0–36.0)
MCHC: 29.3 g/dL — ABNORMAL LOW (ref 30.0–36.0)
MCV: 87.5 fL (ref 78.0–100.0)
MCV: 88.8 fL (ref 78.0–100.0)
PLATELETS: 224 10*3/uL (ref 150–400)
Platelets: 226 10*3/uL (ref 150–400)
RBC: 3.61 MIL/uL — AB (ref 4.22–5.81)
RBC: 3.84 MIL/uL — AB (ref 4.22–5.81)
RDW: 15.4 % (ref 11.5–15.5)
RDW: 15.9 % — ABNORMAL HIGH (ref 11.5–15.5)
WBC: 6.4 10*3/uL (ref 4.0–10.5)
WBC: 7.4 10*3/uL (ref 4.0–10.5)

## 2017-04-25 LAB — RETICULOCYTES
RBC.: 3.84 MIL/uL — ABNORMAL LOW (ref 4.22–5.81)
RETIC COUNT ABSOLUTE: 96 10*3/uL (ref 19.0–186.0)
Retic Ct Pct: 2.5 % (ref 0.4–3.1)

## 2017-04-25 LAB — IRON AND TIBC
IRON: 17 ug/dL — AB (ref 45–182)
Saturation Ratios: 4 % — ABNORMAL LOW (ref 17.9–39.5)
TIBC: 438 ug/dL (ref 250–450)
UIBC: 421 ug/dL

## 2017-04-25 LAB — CREATININE, SERUM: Creatinine, Ser: 0.94 mg/dL (ref 0.61–1.24)

## 2017-04-25 LAB — FERRITIN: Ferritin: 16 ng/mL — ABNORMAL LOW (ref 24–336)

## 2017-04-25 LAB — BASIC METABOLIC PANEL
ANION GAP: 12 (ref 5–15)
BUN: 12 mg/dL (ref 6–20)
CALCIUM: 8.8 mg/dL — AB (ref 8.9–10.3)
CO2: 34 mmol/L — ABNORMAL HIGH (ref 22–32)
CREATININE: 0.94 mg/dL (ref 0.61–1.24)
Chloride: 87 mmol/L — ABNORMAL LOW (ref 101–111)
GFR calc non Af Amer: >60 mL/min (ref >60)
Glucose, Bld: 219 mg/dL — ABNORMAL HIGH (ref 65–99)
Potassium: 3.7 mmol/L (ref 3.5–5.1)
SODIUM: 133 mmol/L — AB (ref 135–145)

## 2017-04-25 LAB — GLUCOSE, CAPILLARY
GLUCOSE-CAPILLARY: 149 mg/dL — AB (ref 65–99)
GLUCOSE-CAPILLARY: 133 mg/dL — AB (ref 65–99)
Glucose-Capillary: 239 mg/dL — ABNORMAL HIGH (ref 65–99)
Glucose-Capillary: 153 mg/dL — ABNORMAL HIGH (ref 65–99)

## 2017-04-25 LAB — VITAMIN B12: Vitamin B-12: 2599 pg/mL — ABNORMAL HIGH (ref 180–914)

## 2017-04-25 LAB — TROPONIN I
TROPONIN I: 0.18 ng/mL — AB (ref <0.03)
TROPONIN I: 0.15 ng/mL — AB (ref <0.03)
Troponin I: 0.22 ng/mL (ref <0.03)

## 2017-04-25 LAB — FOLATE: FOLATE: 12.1 ng/mL (ref >5.9)

## 2017-04-25 LAB — D-DIMER, QUANTITATIVE (NOT AT ARMC): D DIMER QUANT: 2.13 ug{FEU}/mL — AB (ref 0.00–0.50)

## 2017-04-25 MED ORDER — HYDROMORPHONE HCL 1 MG/ML IJ SOLN
1.0000 mg | Freq: Once | INTRAMUSCULAR | Status: AC
  Administered 2017-04-25: 1 mg via INTRAVENOUS
  Filled 2017-04-25: qty 1

## 2017-04-25 MED ORDER — IPRATROPIUM-ALBUTEROL 0.5-2.5 (3) MG/3ML IN SOLN
3.0000 mL | Freq: Four times a day (QID) | RESPIRATORY_TRACT | Status: DC
  Administered 2017-04-25 – 2017-04-27 (×9): 3 mL via RESPIRATORY_TRACT
  Filled 2017-04-25 (×10): qty 3

## 2017-04-25 MED ORDER — KETOROLAC TROMETHAMINE 30 MG/ML IJ SOLN
30.0000 mg | Freq: Once | INTRAMUSCULAR | Status: AC
  Administered 2017-04-25: 30 mg via INTRAVENOUS
  Filled 2017-04-25: qty 1

## 2017-04-25 MED ORDER — IPRATROPIUM-ALBUTEROL 0.5-2.5 (3) MG/3ML IN SOLN
3.0000 mL | RESPIRATORY_TRACT | Status: DC
  Administered 2017-04-25 (×2): 3 mL via RESPIRATORY_TRACT
  Filled 2017-04-25 (×2): qty 3

## 2017-04-25 MED ORDER — SODIUM CHLORIDE 0.9 % IV SOLN
100.0000 mg | Freq: Two times a day (BID) | INTRAVENOUS | Status: DC
  Administered 2017-04-25 – 2017-04-26 (×3): 100 mg via INTRAVENOUS
  Filled 2017-04-25 (×4): qty 100

## 2017-04-25 NOTE — Progress Notes (Addendum)
Bilateral lower extremity venous duplex completed. No evidence of DVT, superficial thrombosis, or Baker's cyst. Toma Copier, RVS 04/25/2017, 11:48 AM

## 2017-04-25 NOTE — Progress Notes (Signed)
[facility]                                                                              Patient Demographics  Manuel Baxter, is a 62 y.o. male, DOB - 1955-07-07, FBP:[id number]  Admit date - 04/24/2017   Admitting Physician Rise Patience, MD  Outpatient Primary MD for the patient is [place]  Outpatient specialists:   LOS - 1  days    Chief Complaint  Patient presents with  . Respiratory Distress       Brief summary  Manuel Baxter is a 62 y.o. male with  medical history significant for but not limited to obstructive sleep apnea and COPD with ongoing tobacco abuse, presenting with 2-day history of productive cough associated with progressively worsening shortness of breath and increasing right wrist with swelling, without fever or chills or chest pain.  At the ED patient was noted to be hypoxic stable with wheezing and treated with steroids and bronchodilator nebulizations with improvement; chest x-ray was negative for acute infiltrate with B type atretic peptide of 155.4 and troponin of 1.35.  Patient admitted for acute bronchitis with secondary COPD exacerbation and mild CHF decompensation.       Assessment & Plan    Principal Problem:   Acute respiratory failure with hypoxia (HCC) Active Problems:   COPD exacerbation (HCC)   Diabetes mellitus type 2 in nonobese (HCC)   Essential hypertension   Normochromic normocytic anemia  #1: Acute hypoxic respiratory failure: -Due to combination of COPD exacerbation and mild CHF decompensation -Continue pulmonary toilet measures/nebulized bronchodilators and steroids -Antibiotics -Respiratory support  #2 acute CHF decompensation: -EF 55-60% with grade 1 diastolic dysfunction.  2D echo 02/2017 -Due to acute bronchitis -Mild -Cycle cardiac enzymes-Gentle diuresis and monitor electrolytes and renal function -Doppler ultrasound for asymmetric edema negative for DVT  #3  hypertension: -Continue beta-blocker -Hold Maxide; on Lasix  #4 diabetes mellitus: - glycemic control IV steroids -Continue sliding scale insulin  Code Status:Full code DVT Prophylaxis: Lovenox Family Communication: Discussed in detail with the patient, all imaging results, lab results explained to the patient    Disposition Plan: Home  Time Spent in minutes 35 minutes  Procedures:  Lower extremity Doppler ultrasound 223 2019-  Consultants:     Antimicrobials:    IV doxycycline  Medications  Scheduled Meds: . aspirin EC  81 mg Oral Daily  . atenolol  50 mg Oral BID  . budesonide (PULMICORT) nebulizer solution  0.25 mg Nebulization BID  . clopidogrel  75 mg Oral Q supper  . cyanocobalamin  500 mcg Oral BID  . DULoxetine  30 mg Oral Daily  . enoxaparin (LOVENOX) injection  40 mg Subcutaneous Q24H  . fluticasone  1 spray Each Nare Daily  . glipiZIDE  2.5 mg Oral BID AC  . insulin aspart  0-9 Units Subcutaneous TID WC  . ipratropium-albuterol  3 mL Nebulization Q4H  . loratadine  10 mg Oral Daily  . methylPREDNISolone (SOLU-MEDROL) injection  40 mg Intravenous Q12H  . montelukast  10 mg Oral Q supper  . omega-3 acid ethyl esters  1 g Oral Daily  .  pantoprazole  40 mg Oral Daily  . triamterene-hydrochlorothiazide  1 tablet Oral Daily   Continuous Infusions: . doxycycline (VIBRAMYCIN) IV 100 mg (04/25/17 0655)   PRN Meds:.albuterol, HYDROmorphone, ondansetron **OR** ondansetron (ZOFRAN) IV   Antibiotics   Anti-infectives (From admission, onward)   Start     Dose/Rate Route Frequency Ordered Stop   04/25/17 0700  doxycycline (VIBRAMYCIN) 100 mg in sodium chloride 0.9 % 250 mL IVPB     100 mg 125 mL/hr over 120 Minutes Intravenous Every 12 hours 04/25/17 1856          Subjective:   Manuel Baxter was seen and examined today.  He feels a lot better this morning.  Denies any fever or chills, no chest pain Objective:   Vitals:   04/25/17 0109 04/25/17 0348  04/25/17 0436 04/25/17 0844  BP:   121/71 114/73  Pulse:   91 87  Resp:   15   Temp:   98.1 F (36.7 C)   TempSrc:   Oral   SpO2: 97% 96% 93%   Weight:      Height:        Intake/Output Summary (Last 24 hours) at 04/25/2017 0908 Last data filed at 04/25/2017 0300 Gross per 24 hour  Intake 240 ml  Output 480 ml  Net -240 ml     Wt Readings from Last 3 Encounters:  04/25/17 78 kg (171 lb 15.3 oz)  02/10/17 98.7 kg (217 lb 9.5 oz)     Exam  General: NAD  HEENT: NCAT,  PERRL,MMM  Neck: SUPPLE, (-) JVD  Cardiovascular: RRR, (-) GALLOP, (-) MURMUR  Respiratory:  mildly diminished breath sounds at the bases with few occasional wheezing  Gastrointestinal: SOFT, (-) DISTENSION, BS(+), (_) TENDERNESS  Ext: (-) CYANOSIS, (+) asymmetric bipedal pitting edema, right t>> left  Neuro: A, OX 3  Skin:(-) RASH  Psych:NORMAL AFFECT/MOOD   Data Reviewed:  I have personally reviewed following labs and imaging studies  Micro Results No results found for this or any previous visit (from the past 240 hour(s)).  Radiology Reports Dg Chest Port 1 View  Result Date: 04/24/2017 CLINICAL DATA:  Dyspnea EXAM: PORTABLE CHEST 1 VIEW COMPARISON:  Chest CT 01/14/2017, CXR 02/08/2017 FINDINGS: Stable heart size and mediastinal contours. Spiculated masslike opacity in the right perihilar region is redemonstrated with interval development of platelike atelectasis along its periphery. Platelike atelectasis is slightly more prominent along the subpleural aspect of the left lower lung as well. Emphysematous hyperinflation of the lungs is otherwise noted without acute pneumonic consolidation, effusion or pulmonary edema. No pneumothorax. No aggressive osseous abnormality. IMPRESSION: Emphysematous lungs with redemonstration of spiculated right perihilar masslike opacity. Bilateral midlung platelike atelectasis is demonstrated, new since prior. No pneumonia or CHF. Electronically Signed   By: Ashley Royalty M.D.   On: 04/24/2017 19:40    Lab Data:  CBC: Recent Labs  Lab 04/24/17 1915 04/24/17 1926 04/24/17 2350 04/25/17 0533  WBC 7.6  --  7.4 6.4  NEUTROABS 5.1  --   --   --   HGB 9.4* 10.9* 10.0* 9.1*  HCT 33.1* 32.0* 34.1* 31.6*  MCV 89.2  --  88.8 87.5  PLT 222  --  226 314   Basic Metabolic Panel: Recent Labs  Lab 04/24/17 1926 04/24/17 2350 04/25/17 0533  NA 136  --  133*  K 3.9  --  3.7  CL 89*  --  87*  CO2  --   --  34*  GLUCOSE 134*  --  219*  BUN 10  --  12  CREATININE 0.90 0.94 0.94  CALCIUM  --   --  8.8*   GFR: Estimated Creatinine Clearance: 76.2 mL/min (by C-G formula based on SCr of 0.94 mg/dL). Liver Function Tests: Recent Labs  Lab 04/24/17 1915  AST 27  ALT 17  ALKPHOS 117  BILITOT 0.4  PROT 6.9  ALBUMIN 3.1*   No results for input(s): LIPASE, AMYLASE in the last 168 hours. No results for input(s): AMMONIA in the last 168 hours. Coagulation Profile: No results for input(s): INR, PROTIME in the last 168 hours. Cardiac Enzymes: Recent Labs  Lab 04/24/17 2350 04/25/17 0533  TROPONINI 0.22* 0.18*   BNP (last 3 results) No results for input(s): PROBNP in the last 8760 hours. HbA1C: No results for input(s): HGBA1C in the last 72 hours. CBG: Recent Labs  Lab 04/25/17 0554  GLUCAP 239*   Lipid Profile: No results for input(s): CHOL, HDL, LDLCALC, TRIG, CHOLHDL, LDLDIRECT in the last 72 hours. Thyroid Function Tests: No results for input(s): TSH, T4TOTAL, FREET4, T3FREE, THYROIDAB in the last 72 hours. Anemia Panel: Recent Labs    04/24/17 2350  VITAMINB12 2,599*  FOLATE 12.1  FERRITIN 16*  TIBC 438  IRON 17*  RETICCTPCT 2.5   Urine analysis:    Component Value Date/Time   COLORURINE YELLOW 02/07/2017 0744   APPEARANCEUR CLEAR 02/07/2017 0744   LABSPEC 1.017 02/07/2017 0744   PHURINE 5.0 02/07/2017 0744   GLUCOSEU 50 (A) 02/07/2017 0744   HGBUR NEGATIVE 02/07/2017 0744   BILIRUBINUR NEGATIVE 02/07/2017 [place] 02/07/2017 0744   PROTEINUR NEGATIVE 02/07/2017 0744   NITRITE NEGATIVE 02/07/2017 0744   LEUKOCYTESUR NEGATIVE 02/07/2017 0744     Benito Mccreedy M.D. [facility] 04/25/2017, 9:08 AM  Pager: [phone] Between 7am to 7pm - call Pager - [phone]  After 7pm go to [email or web address] - password [email or web address]  Call night coverage person covering after 7pm

## 2017-04-25 NOTE — ED Notes (Signed)
C/o generalized pain all over.  Per family pt takes Dilaudid at home for chronic pain.  Admitting MD notified of pt's request for pain medication.  PT has had no output from condom cath yet.  Admitting MD aware.

## 2017-04-26 ENCOUNTER — Inpatient Hospital Stay (HOSPITAL_COMMUNITY): Payer: Medicare Other

## 2017-04-26 DIAGNOSIS — J441 Chronic obstructive pulmonary disease with (acute) exacerbation: Secondary | ICD-10-CM

## 2017-04-26 DIAGNOSIS — I10 Essential (primary) hypertension: Secondary | ICD-10-CM

## 2017-04-26 DIAGNOSIS — J9601 Acute respiratory failure with hypoxia: Secondary | ICD-10-CM

## 2017-04-26 DIAGNOSIS — E119 Type 2 diabetes mellitus without complications: Secondary | ICD-10-CM

## 2017-04-26 LAB — GLUCOSE, CAPILLARY
GLUCOSE-CAPILLARY: 94 mg/dL (ref 65–99)
Glucose-Capillary: 129 mg/dL — ABNORMAL HIGH (ref 65–99)
Glucose-Capillary: 206 mg/dL — ABNORMAL HIGH (ref 65–99)
Glucose-Capillary: 176 mg/dL — ABNORMAL HIGH (ref 65–99)

## 2017-04-26 LAB — BASIC METABOLIC PANEL
Anion gap: 13 (ref 5–15)
BUN: 23 mg/dL — ABNORMAL HIGH (ref 6–20)
CALCIUM: 8.7 mg/dL — AB (ref 8.9–10.3)
CHLORIDE: 86 mmol/L — AB (ref 101–111)
CO2: 29 mmol/L (ref 22–32)
CREATININE: 1.03 mg/dL (ref 0.61–1.24)
GFR calc non Af Amer: >60 mL/min (ref >60)
GLUCOSE: 142 mg/dL — AB (ref 65–99)
Potassium: 3.8 mmol/L (ref 3.5–5.1)
Sodium: 128 mmol/L — ABNORMAL LOW (ref 135–145)

## 2017-04-26 LAB — TROPONIN I: Troponin I: 0.12 ng/mL (ref <0.03)

## 2017-04-26 LAB — BRAIN NATRIURETIC PEPTIDE: B Natriuretic Peptide: 192.8 pg/mL — ABNORMAL HIGH (ref 0.0–100.0)

## 2017-04-26 MED ORDER — DOXYCYCLINE HYCLATE 100 MG PO TABS
100.0000 mg | ORAL_TABLET | Freq: Two times a day (BID) | ORAL | Status: DC
  Administered 2017-04-26 – 2017-04-28 (×4): 100 mg via ORAL
  Filled 2017-04-26 (×4): qty 1

## 2017-04-26 MED ORDER — TECHNETIUM TC 99M DIETHYLENETRIAME-PENTAACETIC ACID
32.8000 | Freq: Once | INTRAVENOUS | Status: AC | PRN
  Administered 2017-04-26: 32.8 via RESPIRATORY_TRACT

## 2017-04-26 MED ORDER — ZOLPIDEM TARTRATE 5 MG PO TABS
5.0000 mg | ORAL_TABLET | Freq: Every evening | ORAL | Status: DC | PRN
  Administered 2017-04-26 – 2017-04-27 (×2): 5 mg via ORAL
  Filled 2017-04-26 (×2): qty 1

## 2017-04-26 MED ORDER — MORPHINE SULFATE (PF) 2 MG/ML IV SOLN
2.0000 mg | INTRAVENOUS | Status: DC | PRN
  Administered 2017-04-27: 2 mg via INTRAVENOUS
  Filled 2017-04-26: qty 1

## 2017-04-26 MED ORDER — FUROSEMIDE 40 MG PO TABS
40.0000 mg | ORAL_TABLET | Freq: Every day | ORAL | Status: DC
  Administered 2017-04-26 – 2017-04-27 (×2): 40 mg via ORAL
  Filled 2017-04-26 (×2): qty 1

## 2017-04-26 MED ORDER — TECHNETIUM TO 99M ALBUMIN AGGREGATED
4.3300 | Freq: Once | INTRAVENOUS | Status: AC | PRN
  Administered 2017-04-26: 4.33 via INTRAVENOUS

## 2017-04-26 MED ORDER — SENNOSIDES-DOCUSATE SODIUM 8.6-50 MG PO TABS
1.0000 | ORAL_TABLET | Freq: Every day | ORAL | Status: DC
  Administered 2017-04-27: 1 via ORAL
  Filled 2017-04-26: qty 1

## 2017-04-26 MED ORDER — FUROSEMIDE 10 MG/ML IJ SOLN
20.0000 mg | Freq: Once | INTRAMUSCULAR | Status: AC
Start: 2017-04-26 — End: 2017-04-26
  Administered 2017-04-26: 20 mg via INTRAVENOUS
  Filled 2017-04-26: qty 2

## 2017-04-26 NOTE — Progress Notes (Signed)
PROGRESS NOTE    Manuel Baxter  VOZ:[id number] DOB: 1955-04-26 DOA: 04/24/2017 PCP: [facility]   Brief Narrative: 62 year old male with history of obstructive sleep apnea, COPD on 3 L of oxygen, ongoing tobacco abuse presented with to 3 days of productive cough associated with shortness of breath.  In the ER patient was found to be hypoxic and wheezy.  Admitted for acute COPD exacerbation.  Assessment & Plan:   #Acute COPD exacerbation: Continue Solu-Medrol, bronchodilator, Claritin, doxycycline.  #Acute on chronic respiratory failure with hypoxia: Currently on 2-3 L of oxygen.  Stable.  #Possible mild acute on chronic diastolic CHF: Echo on January this year reviewed with grade 1 diastolic dysfunction. on oral Lasix.  Patient has elevated d-dimer therefore and mild elevation in troponin.  Check VQ scan since patient has allergy to IV contrast.  Chest x-ray ordered.  Doppler ultrasound of lower extremities negative for DVT.  #Hypertension: Monitor blood pressure.  On atenolol  #Type 2 diabetes: Continue current medication and insulin.  Monitor blood sugar level.  #Hyponatremia: Patient with mild elevation in BNP.  I will order dose of IV Lasix.  Lab in the morning.  Generalized body pain: Patient reported that his body pain is not controlled.  On oral Dilaudid at home.  Added IV morphine as needed for breakthrough pain management.  Started senna as a stool softener  PT, OT evaluation and treatment  DVT prophylaxis: Lovenox subcutaneous Code Status: Full code Family Communication: No family at bedside Disposition Plan: Likely discharge home in 1-2 days    Consultants:   None  Procedures: None Antimicrobials: Doxycycline  Subjective: Seen and examined at bedside.  Reported dry cough, generalized body pain.  Denied chest pain, shortness of breath.  Has dry cough.  No nausea vomiting.  Objective: Vitals:   04/26/17 0324 04/26/17 0715 04/26/17 0816 04/26/17 0900  BP:  122/69  132/76   Pulse:   78   Resp: 16   13  Temp: 98.5 F (36.9 C)     TempSrc: Oral     SpO2: 97% 97%    Weight: 77.2 kg (170 lb 3.1 oz)     Height:        Intake/Output Summary (Last 24 hours) at 04/26/2017 1530 Last data filed at 04/26/2017 0500 Gross per 24 hour  Intake 730 ml  Output 1300 ml  Net -570 ml   Filed Weights   04/24/17 1840 04/25/17 0044 04/26/17 0324  Weight: 76.2 kg (168 lb) 78 kg (171 lb 15.3 oz) 77.2 kg (170 lb 3.1 oz)    Examination:  General exam: Appears calm and comfortable  Respiratory system: Decreased breath sound.  Bilateral expiratory wheeze.  Respiratory effort normal.  Cardiovascular system: S1 & S2 heard, RRR.  No pedal edema. Gastrointestinal system: Abdomen is nondistended, soft and nontender. Normal bowel sounds heard. Central nervous system: Alert and oriented. No focal neurological deficits. Extremities: Symmetric 5 x 5 power. Skin: No rashes, lesions or ulcers Psychiatry: Judgement and insight appear normal. Mood & affect appropriate.     Data Reviewed: I have personally reviewed following labs and imaging studies  CBC: Recent Labs  Lab 04/24/17 1915 04/24/17 1926 04/24/17 2350 04/25/17 0533  WBC 7.6  --  7.4 6.4  NEUTROABS 5.1  --   --   --   HGB 9.4* 10.9* 10.0* 9.1*  HCT 33.1* 32.0* 34.1* 31.6*  MCV 89.2  --  88.8 87.5  PLT 222  --  226 425   Basic Metabolic  Panel: Recent Labs  Lab 04/24/17 1926 04/24/17 2350 04/25/17 0533 04/26/17 0317  NA 136  --  133* 128*  K 3.9  --  3.7 3.8  CL 89*  --  87* 86*  CO2  --   --  34* 29  GLUCOSE 134*  --  219* 142*  BUN 10  --  12 23*  CREATININE 0.90 0.94 0.94 1.03  CALCIUM  --   --  8.8* 8.7*   GFR: Estimated Creatinine Clearance: 69.5 mL/min (by C-G formula based on SCr of 1.03 mg/dL). Liver Function Tests: Recent Labs  Lab 04/24/17 1915  AST 27  ALT 17  ALKPHOS 117  BILITOT 0.4  PROT 6.9  ALBUMIN 3.1*   No results for input(s): LIPASE, AMYLASE in the last  168 hours. No results for input(s): AMMONIA in the last 168 hours. Coagulation Profile: No results for input(s): INR, PROTIME in the last 168 hours. Cardiac Enzymes: Recent Labs  Lab 04/24/17 2350 04/25/17 0533 04/25/17 1046 04/26/17 0317  TROPONINI 0.22* 0.18* 0.15* 0.12*   BNP (last 3 results) No results for input(s): PROBNP in the last 8760 hours. HbA1C: No results for input(s): HGBA1C in the last 72 hours. CBG: Recent Labs  Lab 04/25/17 1155 04/25/17 1620 04/25/17 2109 04/26/17 0619 04/26/17 1221  GLUCAP 153* 149* 133* 129* 206*   Lipid Profile: No results for input(s): CHOL, HDL, LDLCALC, TRIG, CHOLHDL, LDLDIRECT in the last 72 hours. Thyroid Function Tests: No results for input(s): TSH, T4TOTAL, FREET4, T3FREE, THYROIDAB in the last 72 hours. Anemia Panel: Recent Labs    04/24/17 2350  VITAMINB12 2,599*  FOLATE 12.1  FERRITIN 16*  TIBC 438  IRON 17*  RETICCTPCT 2.5   Sepsis Labs: No results for input(s): PROCALCITON, LATICACIDVEN in the last 168 hours.  No results found for this or any previous visit (from the past 240 hour(s)).       Radiology Studies: Dg Chest Port 1 View  Result Date: 04/24/2017 CLINICAL DATA:  Dyspnea EXAM: PORTABLE CHEST 1 VIEW COMPARISON:  Chest CT 01/14/2017, CXR 02/08/2017 FINDINGS: Stable heart size and mediastinal contours. Spiculated masslike opacity in the right perihilar region is redemonstrated with interval development of platelike atelectasis along its periphery. Platelike atelectasis is slightly more prominent along the subpleural aspect of the left lower lung as well. Emphysematous hyperinflation of the lungs is otherwise noted without acute pneumonic consolidation, effusion or pulmonary edema. No pneumothorax. No aggressive osseous abnormality. IMPRESSION: Emphysematous lungs with redemonstration of spiculated right perihilar masslike opacity. Bilateral midlung platelike atelectasis is demonstrated, new since prior. No  pneumonia or CHF. Electronically Signed   By: Ashley Royalty M.D.   On: 04/24/2017 19:40        Scheduled Meds: . aspirin EC  81 mg Oral Daily  . atenolol  50 mg Oral BID  . budesonide (PULMICORT) nebulizer solution  0.25 mg Nebulization BID  . clopidogrel  75 mg Oral Q supper  . cyanocobalamin  500 mcg Oral BID  . doxycycline  100 mg Oral Q12H  . DULoxetine  30 mg Oral Daily  . enoxaparin (LOVENOX) injection  40 mg Subcutaneous Q24H  . fluticasone  1 spray Each Nare Daily  . furosemide  40 mg Oral Daily  . glipiZIDE  2.5 mg Oral BID AC  . insulin aspart  0-9 Units Subcutaneous TID WC  . ipratropium-albuterol  3 mL Nebulization QID  . loratadine  10 mg Oral Daily  . methylPREDNISolone (SOLU-MEDROL) injection  40 mg Intravenous Q12H  .  montelukast  10 mg Oral Q supper  . omega-3 acid ethyl esters  1 g Oral Daily  . pantoprazole  40 mg Oral Daily   Continuous Infusions:   LOS: 2 days    Manuel Walmer Tanna Furry, MD [facility] Pager [phone]  If 7PM-7AM, please contact night-coverage [email or web address] Password [email or web address] 04/26/2017, 3:30 PM

## 2017-04-26 NOTE — Progress Notes (Signed)
Patient states he has a CPAP unit at home, but does not want to use the hospitals CPAP tonight, no distress noted at this time.

## 2017-04-26 NOTE — Progress Notes (Signed)
This patient is receiving the antibiotic(s) doxycycline by the intravenous route. Based on criteria approved by the Pharmacy and Therapeutics Committee, and the Infectious Disease Division, the antibiotic(s) is / are being converted to equivalent oral dose form(s). These criteria include:  . Patient being treated for a respiratory tract infection, urinary tract infection, cellulitis, or Clostridium Difficile Associated Diarrhea . The patient is not neutropenic and does not exhibit a GI malabsorption state . The patient is eating (either orally or per tube) and/or has been taking other orally administered medications for at least 24 hours. . The patient is improving clinically (physician assessment and a 24-hour Tmax of ? 100.5? F).  If you have questions about this conversion, please contact the pharmacy department. Thank you.  Charlene Brooke, PharmD PGY1 Pharmacy Resident Pager: [phone] After 4:00PM please call [place]

## 2017-04-27 LAB — GLUCOSE, CAPILLARY
GLUCOSE-CAPILLARY: 121 mg/dL — AB (ref 65–99)
GLUCOSE-CAPILLARY: 241 mg/dL — AB (ref 65–99)
Glucose-Capillary: 202 mg/dL — ABNORMAL HIGH (ref 65–99)
Glucose-Capillary: 176 mg/dL — ABNORMAL HIGH (ref 65–99)

## 2017-04-27 LAB — BASIC METABOLIC PANEL
ANION GAP: 11 (ref 5–15)
BUN: 24 mg/dL — ABNORMAL HIGH (ref 6–20)
CALCIUM: 8.6 mg/dL — AB (ref 8.9–10.3)
CHLORIDE: 85 mmol/L — AB (ref 101–111)
CO2: 31 mmol/L (ref 22–32)
CREATININE: 0.93 mg/dL (ref 0.61–1.24)
GFR calc Af Amer: >60 mL/min (ref >60)
GFR calc non Af Amer: >60 mL/min (ref >60)
Glucose, Bld: 98 mg/dL (ref 65–99)
Potassium: 3.2 mmol/L — ABNORMAL LOW (ref 3.5–5.1)
SODIUM: 127 mmol/L — AB (ref 135–145)

## 2017-04-27 LAB — MAGNESIUM: Magnesium: 1.8 mg/dL (ref 1.7–2.4)

## 2017-04-27 MED ORDER — PHENOL 1.4 % MT LIQD
1.0000 | OROMUCOSAL | Status: DC | PRN
  Administered 2017-04-27: 1 via OROMUCOSAL
  Filled 2017-04-27: qty 177

## 2017-04-27 MED ORDER — MAGNESIUM SULFATE 2 GM/50ML IV SOLN
2.0000 g | Freq: Once | INTRAVENOUS | Status: AC
  Administered 2017-04-27: 2 g via INTRAVENOUS
  Filled 2017-04-27: qty 50

## 2017-04-27 MED ORDER — FUROSEMIDE 10 MG/ML IJ SOLN
40.0000 mg | Freq: Once | INTRAMUSCULAR | Status: AC
  Administered 2017-04-27: 40 mg via INTRAVENOUS
  Filled 2017-04-27: qty 4

## 2017-04-27 MED ORDER — POTASSIUM CHLORIDE CRYS ER 20 MEQ PO TBCR
40.0000 meq | EXTENDED_RELEASE_TABLET | Freq: Three times a day (TID) | ORAL | Status: AC
  Administered 2017-04-27 – 2017-04-28 (×3): 40 meq via ORAL
  Filled 2017-04-27 (×3): qty 2

## 2017-04-27 MED ORDER — IPRATROPIUM-ALBUTEROL 0.5-2.5 (3) MG/3ML IN SOLN
3.0000 mL | Freq: Three times a day (TID) | RESPIRATORY_TRACT | Status: DC
  Administered 2017-04-27 – 2017-04-28 (×3): 3 mL via RESPIRATORY_TRACT
  Filled 2017-04-27 (×3): qty 3

## 2017-04-27 MED ORDER — METHYLPREDNISOLONE SODIUM SUCC 40 MG IJ SOLR: 40.0000 mg | INTRAMUSCULAR | Status: DC

## 2017-04-27 MED ORDER — POTASSIUM CHLORIDE CRYS ER 20 MEQ PO TBCR
40.0000 meq | EXTENDED_RELEASE_TABLET | Freq: Two times a day (BID) | ORAL | Status: DC
  Administered 2017-04-27: 40 meq via ORAL
  Filled 2017-04-27: qty 2

## 2017-04-27 MED ORDER — FUROSEMIDE 40 MG PO TABS
40.0000 mg | ORAL_TABLET | Freq: Two times a day (BID) | ORAL | Status: DC
  Administered 2017-04-27 – 2017-04-28 (×2): 40 mg via ORAL
  Filled 2017-04-27 (×2): qty 1

## 2017-04-27 NOTE — Progress Notes (Addendum)
PROGRESS NOTE    Manuel Baxter  JOA:[id number] DOB: 01-26-1956 DOA: 04/24/2017 PCP: [facility]   Brief Narrative: 62 year old male with history of obstructive sleep apnea, COPD on 3 L of oxygen, ongoing tobacco abuse presented with to 3 days of productive cough associated with shortness of breath.  In the ER patient was found to be hypoxic and wheezy.  Admitted for acute COPD exacerbation.  Assessment & Plan:   #Acute COPD exacerbation: Has mild wheezing, lowered the dose of Solu-Medrol to daily.  Continue bronchodilator, Claritin and oxygen.  Try to work with PT OT and move out of bed to chair and try to walk.  #Acute on chronic respiratory failure with hypoxia: Currently on 2-3 L of oxygen.  Stable.  #Possible mild acute on chronic diastolic CHF: Echo on January this year reviewed with grade 1 diastolic dysfunction. on oral Lasix.  VQ scan negative for DVT.  PT OT evaluation. -increase lasix to BID today    #Hyponatremia/hypokalemia and borderline low magnesium level: Check urine electrolytes, urine osmolality.  Replete oral potassium chloride and magnesium.  Repeat lab in the morning.  #Hypertension: Monitor blood pressure.  On atenolol  #Type 2 diabetes: Continue current medication and insulin.  Monitor blood sugar level.  #Hyponatremia: Patient with mild elevation in BNP.  I will order dose of IV Lasix.  Lab in the morning.  # Generalized body pain: Patient reported that his body pain is not controlled.  On oral Dilaudid at home.  Try to minimize IV morphine for breakthrough pain.  Try to wean pain medication today.  Senna as a stool softener  PT, OT evaluation and treatment  DVT prophylaxis: Lovenox subcutaneous Code Status: Full code Family Communication: No family at bedside Disposition Plan: Likely discharge home in 1-2 days    Consultants:   None  Procedures: None Antimicrobials: Doxycycline  Subjective: Seen and examined at bedside.  Still reporting  generalized body pain, dry cough.  Denies chest pain, headache or dizziness. Objective: Vitals:   04/27/17 0246 04/27/17 0804 04/27/17 0809 04/27/17 1111  BP:      Pulse:      Resp:      Temp:      TempSrc:      SpO2: 96% 93% 93% 92%  Weight:      Height:        Intake/Output Summary (Last 24 hours) at 04/27/2017 1143 Last data filed at 04/27/2017 0600 Gross per 24 hour  Intake 960 ml  Output 2670 ml  Net -1710 ml   Filed Weights   04/25/17 0044 04/26/17 0324 04/27/17 0235  Weight: 78 kg (171 lb 15.3 oz) 77.2 kg (170 lb 3.1 oz) 78 kg (171 lb 15.3 oz)    Examination:  General exam: Not in distress Respiratory system: Decreased breath sound bilateral, mild expiratory wheeze, respiratory effort normal Cardiovascular system: S1 & S2 heard, RRR.  No pedal edema. Gastrointestinal system: Abdomen is nondistended, soft and nontender. Normal bowel sounds heard. Central nervous system: Alert awake and following commands. Extremities: Symmetric 5 x 5 power. Skin: No rashes, lesions or ulcers Psychiatry: Judgement and insight appear normal. Mood & affect appropriate.     Data Reviewed: I have personally reviewed following labs and imaging studies  CBC: Recent Labs  Lab 04/24/17 1915 04/24/17 1926 04/24/17 2350 04/25/17 0533  WBC 7.6  --  7.4 6.4  NEUTROABS 5.1  --   --   --   HGB 9.4* 10.9* 10.0* 9.1*  HCT 33.1* 32.0*  34.1* 31.6*  MCV 89.2  --  88.8 87.5  PLT 222  --  226 161   Basic Metabolic Panel: Recent Labs  Lab 04/24/17 1926 04/24/17 2350 04/25/17 0533 04/26/17 0317 04/27/17 0317  NA 136  --  133* 128* 127*  K 3.9  --  3.7 3.8 3.2*  CL 89*  --  87* 86* 85*  CO2  --   --  34* 29 31  GLUCOSE 134*  --  219* 142* 98  BUN 10  --  12 23* 24*  CREATININE 0.90 0.94 0.94 1.03 0.93  CALCIUM  --   --  8.8* 8.7* 8.6*  MG  --   --   --   --  1.8   GFR: Estimated Creatinine Clearance: 77 mL/min (by C-G formula based on SCr of 0.93 mg/dL). Liver Function  Tests: Recent Labs  Lab 04/24/17 1915  AST 27  ALT 17  ALKPHOS 117  BILITOT 0.4  PROT 6.9  ALBUMIN 3.1*   No results for input(s): LIPASE, AMYLASE in the last 168 hours. No results for input(s): AMMONIA in the last 168 hours. Coagulation Profile: No results for input(s): INR, PROTIME in the last 168 hours. Cardiac Enzymes: Recent Labs  Lab 04/24/17 2350 04/25/17 0533 04/25/17 1046 04/26/17 0317  TROPONINI 0.22* 0.18* 0.15* 0.12*   BNP (last 3 results) No results for input(s): PROBNP in the last 8760 hours. HbA1C: No results for input(s): HGBA1C in the last 72 hours. CBG: Recent Labs  Lab 04/26/17 0619 04/26/17 1221 04/26/17 1611 04/26/17 2128 04/27/17 0603  GLUCAP 129* 206* 176* 94 121*   Lipid Profile: No results for input(s): CHOL, HDL, LDLCALC, TRIG, CHOLHDL, LDLDIRECT in the last 72 hours. Thyroid Function Tests: No results for input(s): TSH, T4TOTAL, FREET4, T3FREE, THYROIDAB in the last 72 hours. Anemia Panel: Recent Labs    04/24/17 2350  VITAMINB12 2,599*  FOLATE 12.1  FERRITIN 16*  TIBC 438  IRON 17*  RETICCTPCT 2.5   Sepsis Labs: No results for input(s): PROCALCITON, LATICACIDVEN in the last 168 hours.  No results found for this or any previous visit (from the past 240 hour(s)).       Radiology Studies: Dg Chest 2 View  Result Date: 04/26/2017 CLINICAL DATA:  Shortness of breath and chest pain 2 days. EXAM: CHEST - 2 VIEW COMPARISON:  04/24/2017 and 01/13/2017 as well as CT 01/14/2017 FINDINGS: Lordotic technique is demonstrated. Lungs are adequately inflated with slightly less dense irregular right hilar opacification. No additional focal airspace process or effusion. Mild stable cardiomegaly. Remainder of the exam is unchanged. IMPRESSION: No acute findings. Slight improvement in right hilar irregular opacification previously evaluated by CT. Recommend follow-up contrast enhanced chest CT for more accurate evaluation of interval change.  Electronically Signed   By: Marin Olp M.D.   On: 04/26/2017 16:34   Nm Pulmonary Perf And Vent  Result Date: 04/26/2017 CLINICAL DATA:  Evaluate for possible pulmonary embolism. History of obstructive sleep apnea and COPD. Smoker. Productive cough and worsening shortness of breath. History of lung cancer. EXAM: NUCLEAR MEDICINE VENTILATION - PERFUSION LUNG SCAN TECHNIQUE: Ventilation images were obtained in multiple projections using inhaled aerosol Tc-75m DTPA. Perfusion images were obtained in multiple projections after intravenous injection of Tc-51m-MAA. RADIOPHARMACEUTICALS:  32.8 mCi of Tc-38m DTPA aerosol inhalation and 4.33 mCi Tc59m-MAA IV COMPARISON:  Chest x-ray today as well as CT 01/14/2017. FINDINGS: Chest x-ray demonstrates irregular opacification over the right hilar region. Ventilation: Moderate bilateral central airway clumping of  radiotracer. Decrease uptake over the right midlung. Perfusion: Mild shine through of central airway clumping of radiotracer. No wedge shaped peripheral mismatched perfusion defects to suggest acute pulmonary embolism. Matched area of decreased perfusion over the right midlung. IMPRESSION: Findings compatible with low probability for pulmonary embolism. Electronically Signed   By: Marin Olp M.D.   On: 04/26/2017 17:54        Scheduled Meds: . aspirin EC  81 mg Oral Daily  . atenolol  50 mg Oral BID  . budesonide (PULMICORT) nebulizer solution  0.25 mg Nebulization BID  . clopidogrel  75 mg Oral Q supper  . cyanocobalamin  500 mcg Oral BID  . doxycycline  100 mg Oral Q12H  . DULoxetine  30 mg Oral Daily  . enoxaparin (LOVENOX) injection  40 mg Subcutaneous Q24H  . fluticasone  1 spray Each Nare Daily  . furosemide  40 mg Oral Daily  . glipiZIDE  2.5 mg Oral BID AC  . insulin aspart  0-9 Units Subcutaneous TID WC  . ipratropium-albuterol  3 mL Nebulization QID  . loratadine  10 mg Oral Daily  . [START ON 04/28/2017] methylPREDNISolone  (SOLU-MEDROL) injection  40 mg Intravenous Q24H  . montelukast  10 mg Oral Q supper  . omega-3 acid ethyl esters  1 g Oral Daily  . pantoprazole  40 mg Oral Daily  . potassium chloride  40 mEq Oral BID  . senna-docusate  1 tablet Oral QHS   Continuous Infusions: . magnesium sulfate 1 - 4 g bolus IVPB       LOS: 3 days    Tiasha Helvie Tanna Furry, MD [facility] Pager [phone]  If 7PM-7AM, please contact night-coverage [email or web address] Password [facility] 04/27/2017, 11:43 AM

## 2017-04-27 NOTE — Evaluation (Signed)
Physical Therapy Evaluation Patient Details Name: Manuel Baxter MRN: [id number] DOB: 1955/03/15 Today's Date: 04/27/2017   History of Present Illness  62 year old male with history of obstructive sleep apnea, COPD on 3 L of oxygen, ongoing tobacco abuse presented with to 3 days of productive cough associated with shortness of breath.. Admitted for acute COPD exacerbation.  Clinical Impression  Pt is up to stand with minor help but is dropping from 96% O2 sat on 3-4L to 68%, recovered back to 90+% range.  He requires significant supervision for all mobility for now and is against the idea of SNF admission to get home, but have encouraged him to think about it.  Will follow acutely for progression of mobility and ROM to increase his tolerance for activity along with close monitoring of vitals.  O2 sats are the most critical value to ck with visits.    Follow Up Recommendations SNF    Equipment Recommendations  None recommended by PT    Recommendations for Other Services       Precautions / Restrictions Precautions Precautions: Fall Precaution Comments: watch O2 sats Restrictions Weight Bearing Restrictions: No      Mobility  Bed Mobility Overal bed mobility: Modified Independent                Transfers Overall transfer level: Modified independent Equipment used: Rolling walker (2 wheeled)             General transfer comment: reminded about safety with hand placement  Ambulation/Gait             General Gait Details: deferred due to O2 sats dropping with standing  Stairs            Wheelchair Mobility    Modified Rankin (Stroke Patients Only)       Balance Overall balance assessment: Needs assistance Sitting-balance support: Feet supported Sitting balance-Leahy Scale: Good     Standing balance support: Bilateral upper extremity supported Standing balance-Leahy Scale: Fair                               Pertinent Vitals/Pain  Pain Assessment: Faces Faces Pain Scale: Hurts even more Pain Location: head Pain Descriptors / Indicators: Headache Pain Intervention(s): Monitored during session;Repositioned;RN gave pain meds during session    Home Living Family/patient expects to be discharged to:: Private residence Living Arrangements: Spouse/significant other Available Help at Discharge: Available 24 hours/day Type of Home: Mobile home Home Access: Stairs to enter Entrance Stairs-Rails: Right;Left;Can reach both Entrance Stairs-Number of Steps: 4 Home Layout: One level Home Equipment: Walker - 2 wheels;Cane - single point;Bedside commode;Shower seat;Wheelchair - Biomedical engineer Comments: power device is not working    Prior Function Level of Independence: Independent with assistive device(s)         Comments: RW vs SPC and was using power scooter fortrips out of the home     Hand Dominance   Dominant Hand: Right    Extremity/Trunk Assessment   Upper Extremity Assessment Upper Extremity Assessment: Overall WFL for tasks assessed    Lower Extremity Assessment Lower Extremity Assessment: Overall WFL for tasks assessed    Cervical / Trunk Assessment Cervical / Trunk Assessment: Normal  Communication   Communication: No difficulties  Cognition Arousal/Alertness: Awake/alert Behavior During Therapy: WFL for tasks assessed/performed Overall Cognitive Status: Within Functional Limits for tasks assessed  General Comments      Exercises     Assessment/Plan    PT Assessment Patient needs continued PT services  PT Problem List Decreased range of motion;Decreased activity tolerance;Decreased balance;Decreased mobility;Decreased coordination;Decreased knowledge of use of DME;Decreased safety awareness       PT Treatment Interventions DME instruction;Gait training;Stair training;Therapeutic activities;Functional mobility  training;Therapeutic exercise;Balance training;Neuromuscular re-education;Patient/family education    PT Goals (Current goals can be found in the Care Plan section)  Acute Rehab PT Goals Patient Stated Goal: feel better and go home PT Goal Formulation: With patient Time For Goal Achievement: 05/11/17 Potential to Achieve Goals: Good    Frequency Min 2X/week   Barriers to discharge Inaccessible home environment stairs for entrance of home    Co-evaluation               AM-PAC PT "6 Clicks" Daily Activity  Outcome Measure Difficulty turning over in bed (including adjusting bedclothes, sheets and blankets)?: A Little Difficulty moving from lying on back to sitting on the side of the bed? : A Little Difficulty sitting down on and standing up from a chair with arms (e.g., wheelchair, bedside commode, etc,.)?: Unable Help needed moving to and from a bed to chair (including a wheelchair)?: A Little Help needed walking in hospital room?: A Little Help needed climbing 3-5 steps with a railing? : A Lot 6 Click Score: 15    End of Session Equipment Utilized During Treatment: Gait belt;Oxygen Activity Tolerance: Treatment limited secondary to medical complications (Comment) Patient left: in chair;with call bell/phone within reach;with chair alarm set Nurse Communication: Mobility status PT Visit Diagnosis: Unsteadiness on feet (R26.81);Difficulty in walking, not elsewhere classified (R26.2);Adult, failure to thrive (R62.7)    Time: 0601-5615 PT Time Calculation (min) (ACUTE ONLY): 23 min   Charges:   PT Evaluation $PT Eval Moderate Complexity: 1 Mod PT Treatments $Therapeutic Activity: 8-22 mins   PT G Codes:   PT G-Codes **NOT FOR INPATIENT CLASS** Functional Assessment Tool Used: AM-PAC 6 Clicks Basic Mobility    Ramond Dial 04/27/2017, 4:13 PM   Mee Hives, PT MS Acute Rehab Dept. Number: [place]

## 2017-04-27 NOTE — Progress Notes (Signed)
Occupational Therapy Evaluation Patient Details Name: Manuel Baxter MRN: [id number] DOB: 02-Nov-1955 Today's Date: 04/27/2017    History of Present Illness 62 year old male with history of obstructive sleep apnea, COPD on 3 L of oxygen, ongoing tobacco abuse presented with to 3 days of productive cough associated with shortness of breath.. Admitted for acute COPD exacerbation.   Clinical Impression   PTA, pt lived at home with his wife and was modified independent with mobility and ADL @ cane/RW level. Pt states ADL and IADL tasks have become more difficult to complete over the last 2 months so that he does not leave the house very often. Pt desats to 80 on 3L during simple ADL task; dypsnea 3/4; returns to 93 with pursed lip breathing. Unable to ambulate from bed to sink due to WOB. Vitals: MW10; O2 during activity 80 (increased to 4L),at rest 94; RR 24; BP sitting 156/73. Pt states family will be able to assist as needed after DC. Will follow acutely to facilitate safe DC home. Recommend rollator for home use.     Follow Up Recommendations  No OT follow up;Supervision/Assistance - 24 hour    Equipment Recommendations  Other (comment)(rollator)    Recommendations for Other Services PT consult     Precautions / Restrictions Precautions Precautions: Fall Precaution Comments: watch O2 sats      Mobility Bed Mobility Overal bed mobility: Modified Independent                Transfers                      Balance Overall balance assessment: Needs assistance   Sitting balance-Leahy Scale: Good       Standing balance-Leahy Scale: Fair                             ADL either performed or assessed with clinical judgement   ADL Overall ADL's : Needs assistance/impaired     Grooming: Set up;Sitting Grooming Details (indicate cue type and reason): unable to stand to complete grooing due to SOB Upper Body Bathing: Set up;Sitting   Lower Body Bathing:  Minimal assistance;Sit to/from stand   Upper Body Dressing : Minimal assistance;Sitting   Lower Body Dressing: Minimal assistance;Sit to/from stand   Toilet Transfer: Min guard;RW;Stand-pivot   Toileting- Water quality scientist and Hygiene: Supervision/safety(using condom cath)       Functional mobility during ADLs: Minimal assistance;Rolling walker General ADL Comments: complaining of dizziness and feeling unsteady initially when standing; Staes "there is no way I could do a whole bath right now"     Vision Baseline Vision/History: Wears glasses Patient Visual Report: Blurring of vision Additional Comments: states he has had increased difficulty with his vision over the last 2 onths     Perception     Praxis      Pertinent Vitals/Pain Pain Assessment: 0-10 Pain Score: 8  Pain Location: head Pain Descriptors / Indicators: Headache Pain Intervention(s): Limited activity within patient's tolerance;Patient requesting pain meds-RN notified     Hand Dominance Right   Extremity/Trunk Assessment Upper Extremity Assessment Upper Extremity Assessment: Overall WFL for tasks assessed   Lower Extremity Assessment Lower Extremity Assessment: Defer to PT evaluation   Cervical / Trunk Assessment Cervical / Trunk Assessment: Normal   Communication Communication Communication: No difficulties   Cognition Arousal/Alertness: Awake/alert Behavior During Therapy: WFL for tasks assessed/performed Overall Cognitive Status: Within Functional Limits for tasks assessed  General Comments       Exercises     Shoulder Instructions      Home Living Family/patient expects to be discharged to:: Private residence Living Arrangements: Spouse/significant other Available Help at Discharge: Available 24 hours/day Type of Home: Mobile home Home Access: Stairs to enter Entrance Stairs-Number of Steps: 3 Entrance Stairs-Rails:  Right;Left;Can reach both(having his ramp moved tohis current house) Home Layout: One level     Bathroom Shower/Tub: Corporate investment banker: Standard Bathroom Accessibility: Yes How Accessible: Accessible via walker Home Equipment: Strawberry - 2 wheels;Cane - single point;Bedside commode;Shower seat;Wheelchair - Clinical biochemist does not work)          Prior Functioning/Environment Level of Independence: Independent with assistive device(s)        Comments: uses cane/RW at baseline depending on hos his back feels; can do his own ADL; limited amount in community for last 2 months due to difficulty breathing; drives        OT Problem List: Decreased activity tolerance;Decreased knowledge of use of DME or AE;Cardiopulmonary status limiting activity;Pain      OT Treatment/Interventions: Self-care/ADL training;Energy conservation;DME and/or AE instruction;Therapeutic activities;Patient/family education    OT Goals(Current goals can be found in the care plan section) Acute Rehab OT Goals Patient Stated Goal: to get better OT Goal Formulation: With patient Time For Goal Achievement: 05/11/17 Potential to Achieve Goals: Good  OT Frequency: Min 2X/week   Barriers to D/C:            Co-evaluation              AM-PAC PT "6 Clicks" Daily Activity     Outcome Measure Help from another person eating meals?: None Help from another person taking care of personal grooming?: None Help from another person toileting, which includes using toliet, bedpan, or urinal?: A Little Help from another person bathing (including washing, rinsing, drying)?: A Little Help from another person to put on and taking off regular upper body clothing?: A Little Help from another person to put on and taking off regular lower body clothing?: A Little 6 Click Score: 20   End of Session Equipment Utilized During Treatment: Oxygen(3-4L) Nurse Communication: Mobility  status;Patient requests pain meds  Activity Tolerance: Patient limited by fatigue(limited by SOB; desats) Patient left: in chair;with call bell/phone within reach;with chair alarm set  OT Visit Diagnosis: Unsteadiness on feet (R26.81);Muscle weakness (generalized) (M62.81);Pain Pain - part of body: (headache)                Time: 8185-6314 OT Time Calculation (min): 29 min Charges:  OT General Charges $OT Visit: 1 Visit OT Evaluation $OT Eval Moderate Complexity: 1 Mod OT Treatments $Self Care/Home Management : 8-22 mins G-Codes:     [facility], OT/L  [phone] 04/27/2017  Reese Stockman,HILLARY 04/27/2017, 10:19 AM

## 2017-04-27 NOTE — NC FL2 (Signed)
[place] LEVEL OF CARE SCREENING TOOL     IDENTIFICATION  Patient Name: Manuel Baxter Birthdate: March 28, 1955 Sex: male Admission Date (Current Location): 04/24/2017  [facility] and [place] Number:  Herbalist and Address:  [facility], [place] [address] [place], [place] [address]      Provider Number: [id number]  Attending Physician Name and Address:  Rosita Fire, MD  Relative Name and Phone Number:       Current Level of Care: Hospital Recommended Level of Care: [place] Prior Approval Number:    Date Approved/Denied:   PASRR Number: [id number]  Discharge Plan: SNF    Current Diagnoses: Patient Active Problem List   Diagnosis Date Noted  . Acute respiratory failure with hypoxia ([place]) 04/24/2017  . Diabetes mellitus type 2 in nonobese ([place]) 04/24/2017  . Essential hypertension 04/24/2017  . Normochromic normocytic anemia 04/24/2017  . Acute respiratory failure with hypercapnia ([place])   . COPD exacerbation ([place])   . Acute on chronic respiratory failure ([place]) 02/06/2017    Orientation RESPIRATION BLADDER Height & Weight     Self, Time, Situation, Place  O2(Nasal Cannula, 4L) Continent Weight: 171 lb 15.3 oz (78 kg) Height:  5\' 7"  (170.2 cm)  BEHAVIORAL SYMPTOMS/MOOD NEUROLOGICAL BOWEL NUTRITION STATUS      Continent Diet(Heart Healthy/ carb modified, thin liquids.1200 mL Fluid restriction)  AMBULATORY STATUS COMMUNICATION OF NEEDS Skin   Extensive Assist Verbally Normal                       Personal Care Assistance Level of Assistance  Dressing, Feeding, Bathing Bathing Assistance: Maximum assistance Feeding assistance: Limited assistance Dressing Assistance: Maximum assistance     Functional Limitations Info  Hearing, Speech, Sight Sight Info: Adequate Hearing Info: Adequate Speech Info: Adequate    SPECIAL CARE FACTORS FREQUENCY  PT (By licensed PT), OT (By licensed OT)      PT Frequency: 2x OT Frequency: 2x            Contractures Contractures Info: Not present    Additional Factors Info  Code Status, Allergies Code Status Info: Full Code Allergies Info: Doxepin, Lisinopril, Acetaminophen, Contrast Media Iodinated Diagnostic Agents, Gabapentin, Lyrica Pregabalin, Prozac Fluoxetine Hcl           Current Medications (04/27/2017):  This is the current hospital active medication list Current Facility-Administered Medications  Medication Dose Route Frequency Provider Last Rate Last Dose  . albuterol (PROVENTIL) (2.5 MG/3ML) 0.083% nebulizer solution 2.5 mg  2.5 mg Nebulization Q2H PRN Rise Patience, MD   2.5 mg at 04/27/17 0246  . aspirin EC tablet 81 mg  81 mg Oral Daily Rise Patience, MD   81 mg at 04/27/17 1051  . atenolol (TENORMIN) tablet 50 mg  50 mg Oral BID Rise Patience, MD   50 mg at 04/27/17 1052  . budesonide (PULMICORT) nebulizer solution 0.25 mg  0.25 mg Nebulization BID Rise Patience, MD   0.25 mg at 04/27/17 0809  . clopidogrel (PLAVIX) tablet 75 mg  75 mg Oral Q supper Rise Patience, MD   75 mg at 04/26/17 1711  . cyanocobalamin tablet 500 mcg  500 mcg Oral BID Rise Patience, MD   500 mcg at 04/27/17 1053  . doxycycline (VIBRA-TABS) tablet 100 mg  100 mg Oral Q12H Charlton Haws, RPH   100 mg at 04/27/17 1051  . DULoxetine (CYMBALTA) DR capsule 30 mg  30 mg Oral Daily Rise Patience, MD   30 mg at 04/27/17 1052  . enoxaparin (LOVENOX) injection 40 mg  40 mg Subcutaneous Q24H Rise Patience, MD   40 mg at 04/27/17 1053  . fluticasone (FLONASE) 50 MCG/ACT nasal spray 1 spray  1 spray Each Nare Daily Rise Patience, MD   1 spray at 04/27/17 1050  . furosemide (LASIX) tablet 40 mg  40 mg Oral BID Rosita Fire, MD      . glipiZIDE (GLUCOTROL) tablet 2.5 mg  2.5 mg Oral BID AC Rise Patience, MD   2.5 mg at 04/27/17 1051  . HYDROmorphone (DILAUDID) tablet 4 mg  4 mg  Oral Q6H PRN Rise Patience, MD   4 mg at 04/27/17 1051  . insulin aspart (novoLOG) injection 0-9 Units  0-9 Units Subcutaneous TID WC Rise Patience, MD   3 Units at 04/27/17 1230  . ipratropium-albuterol (DUONEB) 0.5-2.5 (3) MG/3ML nebulizer solution 3 mL  3 mL Nebulization TID Rosita Fire, MD   3 mL at 04/27/17 1430  . loratadine (CLARITIN) tablet 10 mg  10 mg Oral Daily Rise Patience, MD   10 mg at 04/27/17 1053  . [START ON 04/28/2017] methylPREDNISolone sodium succinate (SOLU-MEDROL) 40 mg/mL injection 40 mg  40 mg Intravenous Q24H Rosita Fire, MD      . montelukast (SINGULAIR) tablet 10 mg  10 mg Oral Q supper Rise Patience, MD   10 mg at 04/26/17 1711  . morphine 2 MG/ML injection 2 mg  2 mg Intravenous Q4H PRN Rosita Fire, MD   2 mg at 04/27/17 0507  . omega-3 acid ethyl esters (LOVAZA) capsule 1 g  1 g Oral Daily Rise Patience, MD   1 g at 04/27/17 1052  . ondansetron (ZOFRAN) tablet 4 mg  4 mg Oral Q6H PRN Rise Patience, MD       Or  . ondansetron [facility]) injection 4 mg  4 mg Intravenous Q6H PRN Rise Patience, MD      . pantoprazole (PROTONIX) EC tablet 40 mg  40 mg Oral Daily Rise Patience, MD   40 mg at 04/27/17 1053  . potassium chloride SA (K-DUR,KLOR-CON) CR tablet 40 mEq  40 mEq Oral TID Rosita Fire, MD      . senna-docusate (Senokot-S) tablet 1 tablet  1 tablet Oral QHS Rosita Fire, MD      . zolpidem [facility]) tablet 5 mg  5 mg Oral QHS PRN Rosita Fire, MD   5 mg at 04/26/17 2149     Discharge Medications: Please see discharge summary for a list of discharge medications.  Relevant Imaging Results:  Relevant Lab Results:   Additional Information SSN; [id number]  Eileen Stanford, LCSW

## 2017-04-28 LAB — BASIC METABOLIC PANEL
ANION GAP: 10 (ref 5–15)
BUN: 22 mg/dL — ABNORMAL HIGH (ref 6–20)
CALCIUM: 8.8 mg/dL — AB (ref 8.9–10.3)
CO2: 34 mmol/L — ABNORMAL HIGH (ref 22–32)
Chloride: 87 mmol/L — ABNORMAL LOW (ref 101–111)
Creatinine, Ser: 0.95 mg/dL (ref 0.61–1.24)
GLUCOSE: 116 mg/dL — AB (ref 65–99)
Potassium: 4 mmol/L (ref 3.5–5.1)
Sodium: 131 mmol/L — ABNORMAL LOW (ref 135–145)

## 2017-04-28 LAB — GLUCOSE, CAPILLARY
GLUCOSE-CAPILLARY: 91 mg/dL (ref 65–99)
GLUCOSE-CAPILLARY: 237 mg/dL — AB (ref 65–99)

## 2017-04-28 LAB — MAGNESIUM: MAGNESIUM: 2.3 mg/dL (ref 1.7–2.4)

## 2017-04-28 MED ORDER — PREDNISONE 20 MG PO TABS: 40.0000 mg | ORAL_TABLET | Freq: Every day | ORAL | 0 refills | Status: AC

## 2017-04-28 MED ORDER — FUROSEMIDE 40 MG PO TABS: 40.0000 mg | ORAL_TABLET | Freq: Every day | ORAL | 0 refills | Status: AC

## 2017-04-28 MED ORDER — DOXYCYCLINE HYCLATE 100 MG PO TABS: 100.0000 mg | ORAL_TABLET | Freq: Two times a day (BID) | ORAL | 0 refills | Status: AC

## 2017-04-28 NOTE — Care Management Note (Signed)
Case Management Note Marvetta Gibbons RN, BSN Unit 4E-Case Manager [phone]  Patient Details  Name: Manuel Baxter MRN: [id number] Date of Birth: January 14, 1956  Subjective/Objective:   Pt admitted with acute COPD exacerbation, mild CHF                 Action/Plan: PTA Pt lived at home alone,  Per PT eval recommendation for SNF- however pt has declined SNF placement- spoke with pt at bedside for transition of care needs- per pt he has all needed DME at home included walker, cane, electric w/c, manual w/c, shower chair- home 02- with [facility]. Pt has PCP with [facility]-  Pt reports that he does not want SNF placement at this time and he also does not think [place] services would be beneficial. [facility].-- pt declines both SNF and [place] services at this time.    Expected Discharge Date:  04/28/17               Expected Discharge Plan:  Skilled Nursing Facility  In-House Referral:  Clinical Social Work  Discharge planning Services  CM Consult  Post Acute Care Choice:  NA Choice offered to:  NA  DME Arranged:    DME Agency:     HH Arranged:  Refused SNF, Patient Refused [place] Agency:     Status of Service:  Completed, signed off  If discussed at [employer] of Stay Meetings, dates discussed:    Discharge Disposition: home/self care   Additional Comments:  Dawayne Patricia, RN 04/28/2017, 10:56 AM

## 2017-04-28 NOTE — Progress Notes (Signed)
CSW noted RNCM note and conversation with patient. Patient is declining SNF as well as home health. CSW signing off, as no additional needs identified.   Estanislado Emms, [place]

## 2017-04-28 NOTE — Discharge Summary (Signed)
Physician Discharge Summary  Manuel Baxter [id number] DOB: 1955-09-05 DOA: 04/24/2017  [facility]  Admit date: 04/24/2017 Discharge date: 04/28/2017  Admitted From:home Disposition:home (declined SNF)  Recommendations for Outpatient Follow-up:  1. Follow up with PCP in 1-2 weeks 2. Please obtain BMP/CBC in one week  Home Health:yes Equipment/Devices:oxygen Discharge Condition:stable CODE STATUS:full code Diet recommendation:carb modified heart healthy diet  Brief/Interim Summary: 62 year old male with history of obstructive sleep apnea, COPD on 3 L of oxygen, ongoing tobacco abuse presented with to 3 days of productive cough associated with shortness of breath.  In the ER patient was found to be hypoxic and wheezy.  Admitted for acute COPD exacerbation.  #Acute COPD exacerbation:  Treated with a steroid, doxycycline and bronchodilators.  Clinically improved.  Discharging with short course of prednisone and oral doxepin for 2 more days.  Recommend to follow-up with PCP.  Continue bronchodilators at home.  #Acute on chronic respiratory failure with hypoxia: Requiring 3 to 4 L of oxygen.  Continue on follow-up with PCP.  #Possible mild acute on chronic diastolic CHF: Echo on January this year reviewed with grade 1 diastolic dysfunction. on oral Lasix.  VQ scan negative for DVT.   -Continue oral Lasix. -Advised low-salt diet and daily weight.  #Hyponatremia/hypokalemia and borderline low magnesium level: In the setting of hydrochlorothiazide which was discontinued.  Switch to oral Lasix.  Recommended labs monitoring.  #Hypertension: Monitor blood pressure.  On atenolol  #Type 2 diabetes: Continue home medication.  Monitor blood sugar level..  # Generalized body pain, chronic pain management: Patient takes pain medication at home.  Education provided to the patient.  PT OT evaluated patient.  They recommended SNF.  Discussed with the patient today.  He was sitting  on bed and looked more comfortable today.  He reported that he wants to go home with home care services.  Home care ordered.  I advised patient to follow-up with PCP.  Discharge Diagnoses:  Principal Problem:   Acute respiratory failure with hypoxia (HCC) Active Problems:   COPD exacerbation (HCC)   Diabetes mellitus type 2 in nonobese [facility])   Essential hypertension   Normochromic normocytic anemia    Discharge Instructions  Discharge Instructions    (HEART FAILURE PATIENTS) Call MD:  Anytime you have any of the following symptoms: 1) 3 pound weight gain in 24 hours or 5 pounds in 1 week 2) shortness of breath, with or without a dry hacking cough 3) swelling in the hands, feet or stomach 4) if you have to sleep on extra pillows at night in order to breathe.   Complete by:  As directed    Call MD for:  difficulty breathing, headache or visual disturbances   Complete by:  As directed    Call MD for:  extreme fatigue   Complete by:  As directed    Call MD for:  hives   Complete by:  As directed    Call MD for:  persistant dizziness or light-headedness   Complete by:  As directed    Call MD for:  persistant nausea and vomiting   Complete by:  As directed    Call MD for:  severe uncontrolled pain   Complete by:  As directed    Call MD for:  temperature >100.4   Complete by:  As directed    Diet - low sodium heart healthy   Complete by:  As directed    Diet Carb Modified   Complete by:  As directed  Increase activity slowly   Complete by:  As directed      Allergies as of 04/28/2017      Reactions   Doxepin Anaphylaxis   Lisinopril Anaphylaxis   Acetaminophen Other (See Comments)   Elevates liver enzymes,has hx of cirrhosis ( non-alcoholic )   Contrast Media [iodinated Diagnostic Agents] Other (See Comments)   Stopped breathing   Gabapentin Other (See Comments)   Tremors, loopy   Lyrica [pregabalin] Other (See Comments)   nightmares   Prozac [fluoxetine Hcl] Other (See  Comments)   Makes him mean      Medication List    STOP taking these medications   benzonatate 100 MG capsule Commonly known as:  TESSALON   guaiFENesin 600 MG 12 hr tablet Commonly known as:  MUCINEX   menthol-cetylpyridinium 3 MG lozenge Commonly known as:  CEPACOL   oxyCODONE 5 MG immediate release tablet Commonly known as:  Oxy IR/ROXICODONE   OXYGEN   triamterene-hydrochlorothiazide 37.5-25 MG tablet Commonly known as:  MAXZIDE-25     TAKE these medications   aspirin EC 81 MG tablet Take 81 mg by mouth daily.   atenolol 50 MG tablet Commonly known as:  TENORMIN Take 50 mg by mouth 2 (two) times daily.   BC FAST PAIN RELIEF ARTHRITIS 1000-65 MG Pack Generic drug:  Aspirin-Caffeine Take 2 packets by mouth every 4 (four) hours as needed (pain/headache).   clopidogrel 75 MG tablet Commonly known as:  PLAVIX Take 75 mg by mouth daily with supper.   cyanocobalamin 500 MCG tablet Take 500 mcg by mouth 2 (two) times daily. Vitamin B12   doxycycline 100 MG tablet Commonly known as:  VIBRA-TABS Take 1 tablet (100 mg total) by mouth every 12 (twelve) hours for 3 days.   DULoxetine 30 MG capsule Commonly known as:  CYMBALTA Take 30 mg by mouth daily.   Fish Oil 1000 MG Caps Take 1,000-2,000 mg by mouth See admin instructions. Take 2 capsules (2000 mg) by mouth every morning and 1 capsule (1000 mg) before supper   fluticasone 50 MCG/ACT nasal spray Commonly known as:  FLONASE Place 1 spray into both nostrils daily.   furosemide 40 MG tablet Commonly known as:  LASIX Take 1 tablet (40 mg total) by mouth daily.   GAVISCON PO Take 1 tablet by mouth daily as needed (indigestion/heartburn).   glipiZIDE 5 MG tablet Commonly known as:  GLUCOTROL Take 2.5 mg by mouth 2 (two) times daily before a meal.   HYDROmorphone 4 MG tablet Commonly known as:  DILAUDID Take 4 mg by mouth every 6 (six) hours as needed (chronic pain).   ipratropium-albuterol 0.5-2.5 (3)  MG/3ML Soln Commonly known as:  DUONEB Take 3 mLs by nebulization every 6 (six) hours.   loratadine 10 MG tablet Commonly known as:  CLARITIN Take 10 mg by mouth daily.   montelukast 10 MG tablet Commonly known as:  SINGULAIR Take 10 mg by mouth daily with supper.   omeprazole 20 MG capsule Commonly known as:  PRILOSEC Take 20 mg by mouth daily.   OVER THE COUNTER MEDICATION Place 0.5-1 mLs under the tongue See admin instructions. [employer] - place 1/2 dropperful (0.5 ml) under the tongue twice during the day, place 1 dropperful (1 ml) under the tongue at bedtime. 1 ml - 250 mg hemp, 405 mg linoleic acid, 135 mg Alpha Linoleic Acid, 90 m Oleic Acid.   phenylephrine 10 MG Tabs tablet Commonly known as:  SUDAFED PE Take 10  mg by mouth every 4 (four) hours as needed (congestion).   predniSONE 20 MG tablet Commonly known as:  DELTASONE Take 2 tablets (40 mg total) by mouth daily with breakfast for 2 days.   PROAIR HFA 108 (90 Base) MCG/ACT inhaler Generic drug:  albuterol Inhale 2 puffs into the lungs every 6 (six) hours as needed for wheezing or shortness of breath.   tiotropium 18 MCG inhalation capsule Commonly known as:  SPIRIVA Place 18 mcg into inhaler and inhale daily.   Vitamin D 2000 units tablet Take 2,000 Units by mouth 2 (two) times daily.            Durable Medical Equipment  (From admission, onward)        Start     Ordered   04/28/17 1034  DME Oxygen  Once    Question Answer Comment  Mode or (Route) Nasal cannula   Liters per Minute 4   Frequency Continuous (stationary and portable oxygen unit needed)   Oxygen conserving device Yes   Oxygen delivery system Gas      04/28/17 1034     Follow-up [place]. Schedule an appointment as soon as possible for a visit in 1 week(s).   Specialty:  General Practice Contact information: [place] [phone]           Allergies  Allergen Reactions  . Doxepin Anaphylaxis  . Lisinopril Anaphylaxis  . Acetaminophen Other (See Comments)    Elevates liver enzymes,has hx of cirrhosis ( non-alcoholic )  . Contrast Media [Iodinated Diagnostic Agents] Other (See Comments)    Stopped breathing  . Gabapentin Other (See Comments)    Tremors, loopy  . Lyrica [Pregabalin] Other (See Comments)    nightmares  . Prozac [Fluoxetine Hcl] Other (See Comments)    Makes him mean    Consultations: None   Procedures/Studies: none  Subjective: Seen and examined at bedside.  Reported feeling much better.  Denies headache, dizziness, nausea vomiting chest pain shortness of breath.  Discharge Exam: Vitals:   04/28/17 0800 04/28/17 0810  BP:  116/85  Pulse:  70  Resp:  17  Temp:  98 F (36.7 C)  SpO2: 95% 94%   Vitals:   04/28/17 0245 04/28/17 0510 04/28/17 0800 04/28/17 0810  BP:  126/68  116/85  Pulse: (!) 59   70  Resp: 18 12  17   Temp:  98 F (36.7 C)  98 F (36.7 C)  TempSrc:  Oral  Oral  SpO2: 100% 99% 95% 94%  Weight:  75 kg (165 lb 4.8 oz)    Height:        General: Pt is alert, awake, not in acute distress Cardiovascular: RRR, S1/S2 +, no rubs, no gallops Respiratory: CTA bilaterally, no wheezing, no rhonchi Abdominal: Soft, NT, ND, bowel sounds + Extremities: no edema, no cyanosis    The results of significant diagnostics from this hospitalization (including imaging, microbiology, ancillary and laboratory) are listed below for reference.     Microbiology: No results found for this or any previous visit (from the past 240 hour(s)).   Labs: BNP (last 3 results) Recent Labs    04/24/17 1915 04/26/17 0829  BNP 155.4* 366.4*   Basic Metabolic Panel: Recent Labs  Lab 04/24/17 1926 04/24/17 2350 04/25/17 0533 04/26/17 0317 04/27/17 0317 04/28/17 0257  NA 136  --  133* 128* 127* 131*  K 3.9  --  3.7 3.8 3.2* 4.0  CL 89*  --  87* 86* 85* 87*  CO2  --   --  34* 29 31 34*   GLUCOSE 134*  --  219* 142* 98 116*  BUN 10  --  12 23* 24* 22*  CREATININE 0.90 0.94 0.94 1.03 0.93 0.95  CALCIUM  --   --  8.8* 8.7* 8.6* 8.8*  MG  --   --   --   --  1.8 2.3   Liver Function Tests: Recent Labs  Lab 04/24/17 1915  AST 27  ALT 17  ALKPHOS 117  BILITOT 0.4  PROT 6.9  ALBUMIN 3.1*   No results for input(s): LIPASE, AMYLASE in the last 168 hours. No results for input(s): AMMONIA in the last 168 hours. CBC: Recent Labs  Lab 04/24/17 1915 04/24/17 1926 04/24/17 2350 04/25/17 0533  WBC 7.6  --  7.4 6.4  NEUTROABS 5.1  --   --   --   HGB 9.4* 10.9* 10.0* 9.1*  HCT 33.1* 32.0* 34.1* 31.6*  MCV 89.2  --  88.8 87.5  PLT 222  --  226 224   Cardiac Enzymes: Recent Labs  Lab 04/24/17 2350 04/25/17 0533 04/25/17 1046 04/26/17 0317  TROPONINI 0.22* 0.18* 0.15* 0.12*   BNP: Invalid input(s): POCBNP CBG: Recent Labs  Lab 04/27/17 0603 04/27/17 1354 04/27/17 1632 04/27/17 2133 04/28/17 0611  GLUCAP 121* 202* 176* 241* 91   D-Dimer No results for input(s): DDIMER in the last 72 hours. Hgb A1c No results for input(s): HGBA1C in the last 72 hours. Lipid Profile No results for input(s): CHOL, HDL, LDLCALC, TRIG, CHOLHDL, LDLDIRECT in the last 72 hours. Thyroid function studies No results for input(s): TSH, T4TOTAL, T3FREE, THYROIDAB in the last 72 hours.  Invalid input(s): FREET3 Anemia work up No results for input(s): VITAMINB12, FOLATE, FERRITIN, TIBC, IRON, RETICCTPCT in the last 72 hours. Urinalysis    Component Value Date/Time   COLORURINE YELLOW 02/07/2017 [place] 02/07/2017 0744   LABSPEC 1.017 02/07/2017 0744   PHURINE 5.0 02/07/2017 0744   GLUCOSEU 50 (A) 02/07/2017 0744   HGBUR NEGATIVE 02/07/2017 0744   BILIRUBINUR NEGATIVE 02/07/2017 0744   KETONESUR NEGATIVE 02/07/2017 0744   PROTEINUR NEGATIVE 02/07/2017 0744   NITRITE NEGATIVE 02/07/2017 0744   LEUKOCYTESUR NEGATIVE 02/07/2017 0744   Sepsis Labs Invalid  input(s): PROCALCITONIN,  WBC,  LACTICIDVEN Microbiology No results found for this or any previous visit (from the past 240 hour(s)).   Time coordinating discharge: 31 minutes  SIGNED:   Rosita Fire, MD  [facility] 04/28/2017, 10:35 AM  If 7PM-7AM, please contact night-coverage [email or web address] Password [email or web address]

## 2017-04-28 NOTE — Progress Notes (Signed)
Discharge instructions (including medications) discussed with and copy provided to patient/caregiver 

## 2017-05-06 ENCOUNTER — Inpatient Hospital Stay (HOSPITAL_COMMUNITY)
Admission: EM | Admit: 2017-05-06 | Discharge: 2017-05-09 | DRG: 208 | Disposition: A | Discharge status: Home / Self Care | Payer: Medicare Other | Attending: Family Medicine | Admitting: Family Medicine

## 2017-05-06 ENCOUNTER — Other Ambulatory Visit: Payer: Self-pay

## 2017-05-06 ENCOUNTER — Emergency Department (HOSPITAL_COMMUNITY): Payer: Medicare Other

## 2017-05-06 DIAGNOSIS — Z0189 Encounter for other specified special examinations: Secondary | ICD-10-CM

## 2017-05-06 DIAGNOSIS — I110 Hypertensive heart disease with heart failure: Secondary | ICD-10-CM | POA: Diagnosis present

## 2017-05-06 DIAGNOSIS — I2510 Atherosclerotic heart disease of native coronary artery without angina pectoris: Secondary | ICD-10-CM | POA: Diagnosis present

## 2017-05-06 DIAGNOSIS — D72829 Elevated white blood cell count, unspecified: Secondary | ICD-10-CM | POA: Diagnosis present

## 2017-05-06 DIAGNOSIS — K7460 Unspecified cirrhosis of liver: Secondary | ICD-10-CM | POA: Diagnosis present

## 2017-05-06 DIAGNOSIS — T424X1A Poisoning by benzodiazepines, accidental (unintentional), initial encounter: Secondary | ICD-10-CM | POA: Diagnosis not present

## 2017-05-06 DIAGNOSIS — L8990 Pressure ulcer of unspecified site, unspecified stage: Secondary | ICD-10-CM

## 2017-05-06 DIAGNOSIS — Z4682 Encounter for fitting and adjustment of non-vascular catheter: Secondary | ICD-10-CM | POA: Diagnosis not present

## 2017-05-06 DIAGNOSIS — Z7902 Long term (current) use of antithrombotics/antiplatelets: Secondary | ICD-10-CM

## 2017-05-06 DIAGNOSIS — J9621 Acute and chronic respiratory failure with hypoxia: Principal | ICD-10-CM | POA: Diagnosis present

## 2017-05-06 DIAGNOSIS — I5032 Chronic diastolic (congestive) heart failure: Secondary | ICD-10-CM | POA: Diagnosis not present

## 2017-05-06 DIAGNOSIS — Z9981 Dependence on supplemental oxygen: Secondary | ICD-10-CM

## 2017-05-06 DIAGNOSIS — Z888 Allergy status to other drugs, medicaments and biological substances status: Secondary | ICD-10-CM

## 2017-05-06 DIAGNOSIS — K219 Gastro-esophageal reflux disease without esophagitis: Secondary | ICD-10-CM | POA: Diagnosis present

## 2017-05-06 DIAGNOSIS — R0602 Shortness of breath: Secondary | ICD-10-CM | POA: Diagnosis not present

## 2017-05-06 DIAGNOSIS — Z923 Personal history of irradiation: Secondary | ICD-10-CM

## 2017-05-06 DIAGNOSIS — F17210 Nicotine dependence, cigarettes, uncomplicated: Secondary | ICD-10-CM | POA: Diagnosis present

## 2017-05-06 DIAGNOSIS — J439 Emphysema, unspecified: Secondary | ICD-10-CM | POA: Diagnosis not present

## 2017-05-06 DIAGNOSIS — B9729 Other coronavirus as the cause of diseases classified elsewhere: Secondary | ICD-10-CM | POA: Diagnosis present

## 2017-05-06 DIAGNOSIS — Z886 Allergy status to analgesic agent status: Secondary | ICD-10-CM

## 2017-05-06 DIAGNOSIS — J441 Chronic obstructive pulmonary disease with (acute) exacerbation: Secondary | ICD-10-CM

## 2017-05-06 DIAGNOSIS — D649 Anemia, unspecified: Secondary | ICD-10-CM | POA: Diagnosis present

## 2017-05-06 DIAGNOSIS — T402X5A Adverse effect of other opioids, initial encounter: Secondary | ICD-10-CM | POA: Diagnosis present

## 2017-05-06 DIAGNOSIS — Z79891 Long term (current) use of opiate analgesic: Secondary | ICD-10-CM

## 2017-05-06 DIAGNOSIS — Z5320 Procedure and treatment not carried out because of patient's decision for unspecified reasons: Secondary | ICD-10-CM | POA: Diagnosis not present

## 2017-05-06 DIAGNOSIS — C3490 Malignant neoplasm of unspecified part of unspecified bronchus or lung: Secondary | ICD-10-CM | POA: Diagnosis not present

## 2017-05-06 DIAGNOSIS — T402X1A Poisoning by other opioids, accidental (unintentional), initial encounter: Secondary | ICD-10-CM | POA: Diagnosis present

## 2017-05-06 DIAGNOSIS — Z716 Tobacco abuse counseling: Secondary | ICD-10-CM

## 2017-05-06 DIAGNOSIS — J9601 Acute respiratory failure with hypoxia: Secondary | ICD-10-CM | POA: Diagnosis present

## 2017-05-06 DIAGNOSIS — N179 Acute kidney failure, unspecified: Secondary | ICD-10-CM | POA: Diagnosis not present

## 2017-05-06 DIAGNOSIS — Z7951 Long term (current) use of inhaled steroids: Secondary | ICD-10-CM

## 2017-05-06 DIAGNOSIS — I952 Hypotension due to drugs: Secondary | ICD-10-CM | POA: Diagnosis present

## 2017-05-06 DIAGNOSIS — G8929 Other chronic pain: Secondary | ICD-10-CM | POA: Diagnosis present

## 2017-05-06 DIAGNOSIS — Z7984 Long term (current) use of oral hypoglycemic drugs: Secondary | ICD-10-CM

## 2017-05-06 DIAGNOSIS — Z91041 Radiographic dye allergy status: Secondary | ICD-10-CM

## 2017-05-06 DIAGNOSIS — Z8249 Family history of ischemic heart disease and other diseases of the circulatory system: Secondary | ICD-10-CM

## 2017-05-06 DIAGNOSIS — G92 Toxic encephalopathy: Secondary | ICD-10-CM | POA: Diagnosis present

## 2017-05-06 DIAGNOSIS — Z79899 Other long term (current) drug therapy: Secondary | ICD-10-CM

## 2017-05-06 DIAGNOSIS — M549 Dorsalgia, unspecified: Secondary | ICD-10-CM | POA: Diagnosis present

## 2017-05-06 DIAGNOSIS — E11649 Type 2 diabetes mellitus with hypoglycemia without coma: Secondary | ICD-10-CM | POA: Diagnosis not present

## 2017-05-06 DIAGNOSIS — K5903 Drug induced constipation: Secondary | ICD-10-CM | POA: Diagnosis present

## 2017-05-06 DIAGNOSIS — Z7982 Long term (current) use of aspirin: Secondary | ICD-10-CM

## 2017-05-06 DIAGNOSIS — E876 Hypokalemia: Secondary | ICD-10-CM | POA: Diagnosis present

## 2017-05-06 DIAGNOSIS — J9602 Acute respiratory failure with hypercapnia: Secondary | ICD-10-CM | POA: Diagnosis present

## 2017-05-06 DIAGNOSIS — R451 Restlessness and agitation: Secondary | ICD-10-CM | POA: Diagnosis not present

## 2017-05-06 DIAGNOSIS — G4733 Obstructive sleep apnea (adult) (pediatric): Secondary | ICD-10-CM | POA: Diagnosis present

## 2017-05-06 DIAGNOSIS — E785 Hyperlipidemia, unspecified: Secondary | ICD-10-CM | POA: Diagnosis present

## 2017-05-06 DIAGNOSIS — Z9119 Patient's noncompliance with other medical treatment and regimen: Secondary | ICD-10-CM

## 2017-05-06 HISTORY — DX: Unspecified cirrhosis of liver: K74.60

## 2017-05-06 LAB — CBG MONITORING, ED: Glucose-Capillary: 169 mg/dL — ABNORMAL HIGH (ref 65–99)

## 2017-05-06 MED ORDER — METHYLPREDNISOLONE SODIUM SUCC 125 MG IJ SOLR
125.0000 mg | Freq: Once | INTRAMUSCULAR | Status: AC
  Administered 2017-05-07: 125 mg via INTRAVENOUS
  Filled 2017-05-06: qty 2

## 2017-05-06 MED ORDER — NALOXONE HCL 0.4 MG/ML IJ SOLN
0.4000 mg | Freq: Once | INTRAMUSCULAR | Status: AC
  Administered 2017-05-06: 0.4 mg via INTRAVENOUS
  Filled 2017-05-06: qty 1

## 2017-05-06 MED ORDER — ALBUTEROL (5 MG/ML) CONTINUOUS INHALATION SOLN
10.0000 mg/h | INHALATION_SOLUTION | Freq: Once | RESPIRATORY_TRACT | Status: AC
  Administered 2017-05-07: 10 mg/h via RESPIRATORY_TRACT
  Filled 2017-05-06: qty 20

## 2017-05-06 MED ORDER — ONDANSETRON HCL 4 MG/2ML IJ SOLN
4.0000 mg | Freq: Once | INTRAMUSCULAR | Status: AC
  Administered 2017-05-06: 4 mg via INTRAVENOUS
  Filled 2017-05-06: qty 2

## 2017-05-06 MED ORDER — IPRATROPIUM BROMIDE 0.02 % IN SOLN
0.5000 mg | Freq: Once | RESPIRATORY_TRACT | Status: AC
  Administered 2017-05-07: 0.5 mg via RESPIRATORY_TRACT
  Filled 2017-05-06: qty 2.5

## 2017-05-06 NOTE — ED Notes (Signed)
ED Provider at bedside. 

## 2017-05-06 NOTE — ED Triage Notes (Signed)
Pt to ED on CPAP. Wears 3L at home. Seen here 10 days d/c with CHF exascerbation. Yesterday started having difficulty breathing worsened today. O2 85-90 with 3L. Dimished lung sounds bilat. Questionable medication regimen of pt per family. 98.0, HR NSR, CBG 175, 20 RAC.

## 2017-05-06 NOTE — ED Provider Notes (Addendum)
TIME SEEN: 11:48 PM  CHIEF COMPLAINT: Shortness of breath  HPI: Patient is a 62 year old male with history of COPD on 3 L nasal cannula, lung cancer on chronic pain medication who presents to the emergency department with shortness of breath.  Most of the history is obtained by EMS.  They report that patient called out for shortness of breath.  He was found to be hypoxic at 85% on his normal 3 L of oxygen and they started him on CPAP.  He was transitioned to BiPAP in the emergency department and is slowly improving.  Initially was not talking much but is able to talk some more now.  Does report taking his Dilaudid tonight.  Denies any drug or alcohol use.  States to me that he had fever of 102 yesterday and has been coughing up thick yellow sputum.  No vomiting or diarrhea.  Denies chest pain.  Was just recently admitted to the hospitalist service for COPD exacerbation.  ROS: See HPI Constitutional:  fever  Eyes: no drainage  ENT: no runny nose   Cardiovascular:  no chest pain  Resp:  SOB  GI: no vomiting GU: no dysuria Integumentary: no rash  Allergy: no hives  Musculoskeletal: no leg swelling  Neurological: no slurred speech ROS otherwise negative  PAST MEDICAL HISTORY/PAST SURGICAL HISTORY:  Past Medical History:  Diagnosis Date  . COPD (chronic obstructive pulmonary disease) ([place])   . Lung cancer [facility])     MEDICATIONS:  Prior to Admission medications   Medication Sig Start Date End Date Taking? Authorizing Provider  albuterol (PROAIR HFA) 108 (90 Base) MCG/ACT inhaler Inhale 2 puffs into the lungs every 6 (six) hours as needed for wheezing or shortness of breath.    [provider]  Alum Hydroxide-Mag Carbonate (GAVISCON PO) Take 1 tablet by mouth daily as needed (indigestion/heartburn).    [provider]  aspirin EC 81 MG tablet Take 81 mg by mouth daily.    [provider]  Aspirin-Caffeine (BC FAST PAIN RELIEF ARTHRITIS) 1000-65 MG PACK Take 2  packets by mouth every 4 (four) hours as needed (pain/headache).    [provider]  atenolol (TENORMIN) 50 MG tablet Take 50 mg by mouth 2 (two) times daily.    [provider]  Cholecalciferol (VITAMIN D) 2000 units tablet Take 2,000 Units by mouth 2 (two) times daily.    [provider]  clopidogrel (PLAVIX) 75 MG tablet Take 75 mg by mouth daily with supper.    [provider]  cyanocobalamin 500 MCG tablet Take 500 mcg by mouth 2 (two) times daily. Vitamin B12    [provider]  DULoxetine (CYMBALTA) 30 MG capsule Take 30 mg by mouth daily.    [provider]  fluticasone (FLONASE) 50 MCG/ACT nasal spray Place 1 spray into both nostrils daily.    [provider]  furosemide (LASIX) 40 MG tablet Take 1 tablet (40 mg total) by mouth daily. 04/28/17   Rosita Fire, MD  glipiZIDE (GLUCOTROL) 5 MG tablet Take 2.5 mg by mouth 2 (two) times daily before a meal.    [provider]  HYDROmorphone (DILAUDID) 4 MG tablet Take 4 mg by mouth every 6 (six) hours as needed (chronic pain).    [provider]  ipratropium-albuterol (DUONEB) 0.5-2.5 (3) MG/3ML SOLN Take 3 mLs by nebulization every 6 (six) hours. 02/10/17   Lavina Hamman, MD  loratadine (CLARITIN) 10 MG tablet Take 10 mg by mouth daily.  [provider]  montelukast (SINGULAIR) 10 MG tablet Take 10 mg by mouth daily with supper.    [provider]  Omega-3 Fatty Acids (FISH OIL) 1000 MG CAPS Take 1,000-2,000 mg by mouth See admin instructions. Take 2 capsules (2000 mg) by mouth every morning and 1 capsule (1000 mg) before supper    [provider]  omeprazole (PRILOSEC) 20 MG capsule Take 20 mg by mouth daily.    [provider]  OVER THE COUNTER MEDICATION Place 0.5-1 mLs under the tongue See admin instructions. [employer] - place 1/2 dropperful (0.5 ml) under the tongue twice during the day, place 1  dropperful (1 ml) under the tongue at bedtime. 1 ml - 250 mg hemp, 405 mg linoleic acid, 135 mg Alpha Linoleic Acid, 90 m Oleic Acid.    [provider]  phenylephrine (SUDAFED PE) 10 MG TABS tablet Take 10 mg by mouth every 4 (four) hours as needed (congestion).    [provider]  tiotropium (SPIRIVA) 18 MCG inhalation capsule Place 18 mcg into inhaler and inhale daily.     [provider]    ALLERGIES:  Allergies  Allergen Reactions  . Doxepin Anaphylaxis  . Lisinopril Anaphylaxis  . Acetaminophen Other (See Comments)    Elevates liver enzymes,has hx of cirrhosis ( non-alcoholic )  . Contrast Media [Iodinated Diagnostic Agents] Other (See Comments)    Stopped breathing  . Gabapentin Other (See Comments)    Tremors, loopy  . Lyrica [Pregabalin] Other (See Comments)    nightmares  . Prozac [Fluoxetine Hcl] Other (See Comments)    Makes him mean    SOCIAL HISTORY:  Social History   Tobacco Use  . Smoking status: Current Every Day Smoker  . Smokeless tobacco: Never Used  Substance Use Topics  . Alcohol use: No    Frequency: Never    FAMILY HISTORY: Family History  Problem Relation Age of Onset  . Hypertension Other     EXAM: BP 115/79 (BP Location: Left Arm)   Pulse 67   Resp 14   SpO2 100% Rectal temp 99.1 F CONSTITUTIONAL: Alert and oriented to person and place.  States that his 2018.  He will respond to some questions appropriately.  He is drowsy but arousable to voice.  Nursing staff reports that he is improving since being placed on BiPAP. HEAD: Normocephalic EYES: Conjunctivae clear, pupils appear equal, EOMI ENT: normal nose; moist mucous membranes NECK: Supple, no meningismus, no nuchal rigidity, no LAD  CARD: RRR; S1 and S2 appreciated; no murmurs, no clicks, no rubs, no gallops RESP: Is tachypneic.  He is doing well on BiPAP.  Diminished at bases bilaterally but no significant wheezing, rhonchi or rales.  Speaking short  sentences. ABD/GI: Normal bowel sounds; non-distended; soft, non-tender, no rebound, no guarding, no peritoneal signs, no hepatosplenomegaly BACK:  The back appears normal and is non-tender to palpation, there is no CVA tenderness EXT: Normal ROM in all joints; non-tender to palpation; no edema; normal capillary refill; no cyanosis, no calf tenderness or swelling    SKIN: Normal color for age and race; warm; no rash NEURO: Moves all extremities equally; Reports normal sensation diffusely, cranial nerves II through XII intact, normal speech     MEDICAL DECISION MAKING: Patient here with possible COPD exacerbation.  He is very drowsy here but no pinpoint pupils.  I did give him a small amount of Narcan which did wake him up and he started coughing up thick yellow  sputum.  I was able to take him off of BiPAP given his gas was reassuring other than mildly low PO2.  Placed back on his 3 L nasal cannula and will give breathing treatments, steroids.  Chest x-ray shows no acute abnormality.  Will check ammonia level, urine and urine drug screen.  He is moving all extremities.  We will start albuterol nebulizer treatment, atrovent and give Solumedrol.  ED PROGRESS: On my reevaluation patient seems more obtunded.  He was given another 0.4 mg of Narcan without any change.  It was then given another milligram of Narcan and now seems to be restless and moaning but still does not open his eyes or follow commands.  At this point I feel he needs to be intubated for airway protection.  He is coughing up thick yellow sputum still.  No vomiting.  I am worried that he is at risk for aspiration.  Will repeat ABG after intubation.     He does have a leukocytosis of 19,000 which may be from recent steroid use but given he told me had a fever of 102 I will treat him for HCAP with IV Levaquin.  His rectal temp was 99.1.  He has had normal lactate.  Blood cultures have been sent.  Will obtain urinalysis.  Flu also  pending.  Repeat ABG shows no significant change other than improvement of SPO2.  Repeat chest x-ray shows no change.  Updated family.  Discussed with critical care for admission.  Will sedate with propofol.  Drug screen positive for benzodiazepines as well which may be contributing to his state of being so drowsy, obtunded.  I suspect that this may be more related to polypharmacy than COPD exacerbation.  Will obtain head CT as well.  No focal neuro deficits earlier other than being drowsy and disoriented to year.         EKG Interpretation  Date/Time:  Wednesday May 06 2017 23:26:04 EDT Ventricular Rate:  70 PR Interval:    QRS Duration: 91 QT Interval:  375 QTC Calculation: 405 R Axis:   85 Text Interpretation:  Sinus rhythm Borderline right axis deviation No significant change since last tracing Confirmed by Nichalas Coin, Cyril Mourning [phone]) on 05/06/2017 11:51:08 PM         CRITICAL CARE Performed by: Cyril Mourning Keiarra Charon   Total critical care time: 45 minutes  Critical care time was exclusive of separately billable procedures and treating other patients.  Critical care was necessary to treat or prevent imminent or life-threatening deterioration.  Critical care was time spent personally by me on the following activities: development of treatment plan with patient and/or surrogate as well as nursing, discussions with consultants, evaluation of patient's response to treatment, examination of patient, obtaining history from patient or surrogate, ordering and performing treatments and interventions, ordering and review of laboratory studies, ordering and review of radiographic studies, pulse oximetry and re-evaluation of patient's condition.    Procedure Name: Intubation Date/Time: 05/07/2017 3:00 AM Performed by: Kenyada Dosch, Delice Bison, DO Pre-anesthesia Checklist: Patient identified, Patient being monitored, Emergency Drugs available, Timeout performed and Suction available Oxygen Delivery Method:  Non-rebreather mask Preoxygenation: Pre-oxygenation with 100% oxygen Induction Type: Rapid sequence Ventilation: Mask ventilation without difficulty Laryngoscope Size: Glidescope and 3 Grade View: Grade II Tube size: 7.5 mm Number of attempts: 1 Placement Confirmation: ETT inserted through vocal cords under direct vision,  CO2 detector and Breath sounds checked- equal and bilateral Secured at: 23 cm Tube secured with: Tape  Sharlett Lienemann, Delice Bison, DO 05/07/17 0413    Geneva Pallas, Delice Bison, DO 05/07/17 [phone]

## 2017-05-07 ENCOUNTER — Inpatient Hospital Stay (HOSPITAL_COMMUNITY): Payer: Medicare Other

## 2017-05-07 ENCOUNTER — Encounter (HOSPITAL_COMMUNITY): Payer: Self-pay

## 2017-05-07 ENCOUNTER — Emergency Department (HOSPITAL_COMMUNITY): Payer: Medicare Other

## 2017-05-07 DIAGNOSIS — E11649 Type 2 diabetes mellitus with hypoglycemia without coma: Secondary | ICD-10-CM | POA: Diagnosis not present

## 2017-05-07 DIAGNOSIS — G4733 Obstructive sleep apnea (adult) (pediatric): Secondary | ICD-10-CM | POA: Diagnosis present

## 2017-05-07 DIAGNOSIS — J9601 Acute respiratory failure with hypoxia: Secondary | ICD-10-CM

## 2017-05-07 DIAGNOSIS — J9690 Respiratory failure, unspecified, unspecified whether with hypoxia or hypercapnia: Secondary | ICD-10-CM | POA: Diagnosis not present

## 2017-05-07 DIAGNOSIS — I5032 Chronic diastolic (congestive) heart failure: Secondary | ICD-10-CM | POA: Diagnosis present

## 2017-05-07 DIAGNOSIS — D72829 Elevated white blood cell count, unspecified: Secondary | ICD-10-CM | POA: Diagnosis present

## 2017-05-07 DIAGNOSIS — K219 Gastro-esophageal reflux disease without esophagitis: Secondary | ICD-10-CM | POA: Diagnosis present

## 2017-05-07 DIAGNOSIS — M549 Dorsalgia, unspecified: Secondary | ICD-10-CM | POA: Diagnosis present

## 2017-05-07 DIAGNOSIS — L8990 Pressure ulcer of unspecified site, unspecified stage: Secondary | ICD-10-CM

## 2017-05-07 DIAGNOSIS — Z79899 Other long term (current) drug therapy: Secondary | ICD-10-CM

## 2017-05-07 DIAGNOSIS — J439 Emphysema, unspecified: Secondary | ICD-10-CM | POA: Diagnosis not present

## 2017-05-07 DIAGNOSIS — G8929 Other chronic pain: Secondary | ICD-10-CM | POA: Diagnosis present

## 2017-05-07 DIAGNOSIS — N179 Acute kidney failure, unspecified: Secondary | ICD-10-CM

## 2017-05-07 DIAGNOSIS — E876 Hypokalemia: Secondary | ICD-10-CM | POA: Diagnosis present

## 2017-05-07 DIAGNOSIS — Z4682 Encounter for fitting and adjustment of non-vascular catheter: Secondary | ICD-10-CM | POA: Diagnosis not present

## 2017-05-07 DIAGNOSIS — E785 Hyperlipidemia, unspecified: Secondary | ICD-10-CM | POA: Diagnosis present

## 2017-05-07 DIAGNOSIS — J690 Pneumonitis due to inhalation of food and vomit: Secondary | ICD-10-CM | POA: Diagnosis not present

## 2017-05-07 DIAGNOSIS — B9729 Other coronavirus as the cause of diseases classified elsewhere: Secondary | ICD-10-CM | POA: Diagnosis present

## 2017-05-07 DIAGNOSIS — F17210 Nicotine dependence, cigarettes, uncomplicated: Secondary | ICD-10-CM | POA: Diagnosis present

## 2017-05-07 DIAGNOSIS — I952 Hypotension due to drugs: Secondary | ICD-10-CM | POA: Diagnosis present

## 2017-05-07 DIAGNOSIS — G92 Toxic encephalopathy: Secondary | ICD-10-CM | POA: Diagnosis present

## 2017-05-07 DIAGNOSIS — K5903 Drug induced constipation: Secondary | ICD-10-CM | POA: Diagnosis present

## 2017-05-07 DIAGNOSIS — D649 Anemia, unspecified: Secondary | ICD-10-CM | POA: Diagnosis present

## 2017-05-07 DIAGNOSIS — J9602 Acute respiratory failure with hypercapnia: Secondary | ICD-10-CM | POA: Diagnosis present

## 2017-05-07 DIAGNOSIS — I110 Hypertensive heart disease with heart failure: Secondary | ICD-10-CM | POA: Diagnosis present

## 2017-05-07 DIAGNOSIS — R451 Restlessness and agitation: Secondary | ICD-10-CM | POA: Diagnosis not present

## 2017-05-07 DIAGNOSIS — K7460 Unspecified cirrhosis of liver: Secondary | ICD-10-CM | POA: Diagnosis present

## 2017-05-07 DIAGNOSIS — J441 Chronic obstructive pulmonary disease with (acute) exacerbation: Secondary | ICD-10-CM | POA: Diagnosis not present

## 2017-05-07 DIAGNOSIS — C3490 Malignant neoplasm of unspecified part of unspecified bronchus or lung: Secondary | ICD-10-CM | POA: Diagnosis present

## 2017-05-07 DIAGNOSIS — J9621 Acute and chronic respiratory failure with hypoxia: Secondary | ICD-10-CM | POA: Diagnosis present

## 2017-05-07 DIAGNOSIS — I2510 Atherosclerotic heart disease of native coronary artery without angina pectoris: Secondary | ICD-10-CM | POA: Diagnosis present

## 2017-05-07 DIAGNOSIS — R4020 Unspecified coma: Secondary | ICD-10-CM | POA: Diagnosis not present

## 2017-05-07 LAB — I-STAT ARTERIAL BLOOD GAS, ED
ACID-BASE EXCESS: 10 mmol/L — AB (ref 0.0–2.0)
Acid-Base Excess: 12 mmol/L — ABNORMAL HIGH (ref 0.0–2.0)
BICARBONATE: 37.1 mmol/L — AB (ref 20.0–28.0)
Bicarbonate: 39.6 mmol/L — ABNORMAL HIGH (ref 20.0–28.0)
O2 SAT: 100 %
O2 Saturation: 92 %
PH ART: 7.412 (ref 7.350–7.450)
PO2 ART: 65 mmHg — AB (ref 83.0–108.0)
Patient temperature: 98
TCO2: 39 mmol/L — ABNORMAL HIGH (ref 22–32)
TCO2: 41 mmol/L — AB (ref 22–32)
pCO2 arterial: 58.2 mmHg — ABNORMAL HIGH (ref 32.0–48.0)
pCO2 arterial: 60.7 mmHg — ABNORMAL HIGH (ref 32.0–48.0)
pH, Arterial: 7.421 (ref 7.350–7.450)
pO2, Arterial: 460 mmHg — ABNORMAL HIGH (ref 83.0–108.0)

## 2017-05-07 LAB — LACTIC ACID, PLASMA
Lactic Acid, Venous: 1.9 mmol/L (ref 0.5–1.9)
Lactic Acid, Venous: 2.8 mmol/L (ref 0.5–1.9)

## 2017-05-07 LAB — CBC WITH DIFFERENTIAL/PLATELET
BASOS PCT: 0 %
Basophils Absolute: 0 10*3/uL (ref 0.0–0.1)
EOS ABS: 0.2 10*3/uL (ref 0.0–0.7)
EOS PCT: 1 %
HCT: 36.8 % — ABNORMAL LOW (ref 39.0–52.0)
Hemoglobin: 11.3 g/dL — ABNORMAL LOW (ref 13.0–17.0)
LYMPHS PCT: 14 %
Lymphs Abs: 2.7 10*3/uL (ref 0.7–4.0)
MCH: 25.1 pg — AB (ref 26.0–34.0)
MCHC: 30.7 g/dL (ref 30.0–36.0)
MCV: 81.8 fL (ref 78.0–100.0)
MONOS PCT: 8 %
Monocytes Absolute: 1.6 10*3/uL — ABNORMAL HIGH (ref 0.1–1.0)
NEUTROS PCT: 77 %
Neutro Abs: 15 10*3/uL — ABNORMAL HIGH (ref 1.7–7.7)
PLATELETS: 253 10*3/uL (ref 150–400)
RBC: 4.5 MIL/uL (ref 4.22–5.81)
RDW: 16 % — ABNORMAL HIGH (ref 11.5–15.5)
WBC: 19.5 10*3/uL — ABNORMAL HIGH (ref 4.0–10.5)

## 2017-05-07 LAB — COMPREHENSIVE METABOLIC PANEL
ALT: 30 U/L (ref 17–63)
AST: 36 U/L (ref 15–41)
Albumin: 3.5 g/dL (ref 3.5–5.0)
Alkaline Phosphatase: 130 U/L — ABNORMAL HIGH (ref 38–126)
Anion gap: 14 (ref 5–15)
BUN: 59 mg/dL — ABNORMAL HIGH (ref 6–20)
CHLORIDE: 81 mmol/L — AB (ref 101–111)
CO2: 35 mmol/L — AB (ref 22–32)
CREATININE: 2.13 mg/dL — AB (ref 0.61–1.24)
Calcium: 10.2 mg/dL (ref 8.9–10.3)
GFR, EST AFRICAN AMERICAN: 37 mL/min — AB (ref >60)
GFR, EST NON AFRICAN AMERICAN: 32 mL/min — AB (ref >60)
Glucose, Bld: 169 mg/dL — ABNORMAL HIGH (ref 65–99)
Potassium: 3.2 mmol/L — ABNORMAL LOW (ref 3.5–5.1)
SODIUM: 130 mmol/L — AB (ref 135–145)
Total Bilirubin: 0.7 mg/dL (ref 0.3–1.2)
Total Protein: 7.6 g/dL (ref 6.5–8.1)

## 2017-05-07 LAB — BASIC METABOLIC PANEL
Anion gap: 13 (ref 5–15)
BUN: 59 mg/dL — AB (ref 6–20)
CALCIUM: 9.1 mg/dL (ref 8.9–10.3)
CHLORIDE: 87 mmol/L — AB (ref 101–111)
CO2: 30 mmol/L (ref 22–32)
CREATININE: 1.97 mg/dL — AB (ref 0.61–1.24)
GFR calc non Af Amer: 35 mL/min — ABNORMAL LOW (ref >60)
GFR, EST AFRICAN AMERICAN: 40 mL/min — AB (ref >60)
Glucose, Bld: 239 mg/dL — ABNORMAL HIGH (ref 65–99)
Potassium: 3.2 mmol/L — ABNORMAL LOW (ref 3.5–5.1)
Sodium: 130 mmol/L — ABNORMAL LOW (ref 135–145)

## 2017-05-07 LAB — MAGNESIUM: Magnesium: 1.9 mg/dL (ref 1.7–2.4)

## 2017-05-07 LAB — URINALYSIS, ROUTINE W REFLEX MICROSCOPIC
Bilirubin Urine: NEGATIVE
Glucose, UA: NEGATIVE mg/dL
KETONES UR: NEGATIVE mg/dL
Leukocytes, UA: NEGATIVE
NITRITE: NEGATIVE
PROTEIN: NEGATIVE mg/dL
Specific Gravity, Urine: 1.01 (ref 1.005–1.030)
Squamous Epithelial / LPF: NONE SEEN
pH: 5 (ref 5.0–8.0)

## 2017-05-07 LAB — RESPIRATORY PANEL BY PCR
ADENOVIRUS-RVPPCR: NOT DETECTED
Bordetella pertussis: NOT DETECTED
CORONAVIRUS NL63-RVPPCR: NOT DETECTED
CORONAVIRUS OC43-RVPPCR: NOT DETECTED
Chlamydophila pneumoniae: NOT DETECTED
Coronavirus 229E: DETECTED — AB
Coronavirus HKU1: NOT DETECTED
INFLUENZA A-RVPPCR: NOT DETECTED
INFLUENZA B-RVPPCR: NOT DETECTED
METAPNEUMOVIRUS-RVPPCR: NOT DETECTED
MYCOPLASMA PNEUMONIAE-RVPPCR: NOT DETECTED
PARAINFLUENZA VIRUS 1-RVPPCR: NOT DETECTED
PARAINFLUENZA VIRUS 2-RVPPCR: NOT DETECTED
PARAINFLUENZA VIRUS 3-RVPPCR: NOT DETECTED
PARAINFLUENZA VIRUS 4-RVPPCR: NOT DETECTED
RHINOVIRUS / ENTEROVIRUS - RVPPCR: NOT DETECTED
Respiratory Syncytial Virus: NOT DETECTED

## 2017-05-07 LAB — MRSA PCR SCREENING: MRSA by PCR: NEGATIVE

## 2017-05-07 LAB — RAPID URINE DRUG SCREEN, HOSP PERFORMED
Amphetamines: NOT DETECTED
Barbiturates: NOT DETECTED
Benzodiazepines: POSITIVE — AB
COCAINE: NOT DETECTED
OPIATES: POSITIVE — AB
Tetrahydrocannabinol: NOT DETECTED

## 2017-05-07 LAB — GLUCOSE, CAPILLARY
GLUCOSE-CAPILLARY: 223 mg/dL — AB (ref 65–99)
GLUCOSE-CAPILLARY: 217 mg/dL — AB (ref 65–99)
Glucose-Capillary: 158 mg/dL — ABNORMAL HIGH (ref 65–99)
Glucose-Capillary: 151 mg/dL — ABNORMAL HIGH (ref 65–99)

## 2017-05-07 LAB — I-STAT CG4 LACTIC ACID, ED
LACTIC ACID, VENOUS: 1.68 mmol/L (ref 0.5–1.9)
LACTIC ACID, VENOUS: 1.86 mmol/L (ref 0.5–1.9)

## 2017-05-07 LAB — PHOSPHORUS: PHOSPHORUS: 3.3 mg/dL (ref 2.5–4.6)

## 2017-05-07 LAB — CBC
HEMATOCRIT: 30.7 % — AB (ref 39.0–52.0)
Hemoglobin: 9.4 g/dL — ABNORMAL LOW (ref 13.0–17.0)
MCH: 24.9 pg — AB (ref 26.0–34.0)
MCHC: 30.6 g/dL (ref 30.0–36.0)
MCV: 81.2 fL (ref 78.0–100.0)
Platelets: 190 10*3/uL (ref 150–400)
RBC: 3.78 MIL/uL — ABNORMAL LOW (ref 4.22–5.81)
RDW: 16.1 % — ABNORMAL HIGH (ref 11.5–15.5)
WBC: 11.3 10*3/uL — ABNORMAL HIGH (ref 4.0–10.5)

## 2017-05-07 LAB — AMMONIA: AMMONIA: 28 umol/L (ref 9–35)

## 2017-05-07 LAB — INFLUENZA PANEL BY PCR (TYPE A & B)
INFLBPCR: NEGATIVE
Influenza A By PCR: NEGATIVE

## 2017-05-07 LAB — CORTISOL: CORTISOL PLASMA: 26.6 ug/dL

## 2017-05-07 LAB — SODIUM, URINE, RANDOM: Sodium, Ur: 13 mmol/L

## 2017-05-07 LAB — CREATININE, URINE, RANDOM: Creatinine, Urine: 68.5 mg/dL

## 2017-05-07 LAB — ETHANOL: Alcohol, Ethyl (B): <10 mg/dL (ref <10)

## 2017-05-07 LAB — I-STAT TROPONIN, ED: TROPONIN I, POC: 0 ng/mL (ref 0.00–0.08)

## 2017-05-07 LAB — PROCALCITONIN: PROCALCITONIN: 0.17 ng/mL

## 2017-05-07 MED ORDER — ALUM HYDROXIDE-MAG CARBONATE 95-358 MG/15ML PO SUSP
30.0000 mL | Freq: Every day | ORAL | Status: DC | PRN
  Administered 2017-05-08: 30 mL via ORAL
  Filled 2017-05-07 (×3): qty 30

## 2017-05-07 MED ORDER — IPRATROPIUM-ALBUTEROL 0.5-2.5 (3) MG/3ML IN SOLN
3.0000 mL | Freq: Four times a day (QID) | RESPIRATORY_TRACT | Status: DC
  Administered 2017-05-07 (×2): 3 mL via RESPIRATORY_TRACT
  Filled 2017-05-07 (×2): qty 3

## 2017-05-07 MED ORDER — ORAL CARE MOUTH RINSE: 15.0000 mL | Freq: Four times a day (QID) | OROMUCOSAL | Status: DC

## 2017-05-07 MED ORDER — VITAMIN D 1000 UNITS PO TABS
2000.0000 [IU] | ORAL_TABLET | Freq: Two times a day (BID) | ORAL | Status: DC
Start: 2017-05-07 — End: 2017-05-09
  Administered 2017-05-07 – 2017-05-09 (×4): 2000 [IU] via ORAL
  Filled 2017-05-07 (×4): qty 2

## 2017-05-07 MED ORDER — CHLORHEXIDINE GLUCONATE 0.12% ORAL RINSE (MEDLINE KIT): 15.0000 mL | Freq: Two times a day (BID) | OROMUCOSAL | Status: DC

## 2017-05-07 MED ORDER — LEVOFLOXACIN IN D5W 750 MG/150ML IV SOLN
750.0000 mg | Freq: Once | INTRAVENOUS | Status: AC
  Administered 2017-05-07: 750 mg via INTRAVENOUS
  Filled 2017-05-07: qty 150

## 2017-05-07 MED ORDER — VITAL HIGH PROTEIN PO LIQD: 1000.0000 mL | ORAL | Status: DC

## 2017-05-07 MED ORDER — PROPOFOL 1000 MG/100ML IV EMUL
INTRAVENOUS | Status: AC
  Filled 2017-05-07: qty 100

## 2017-05-07 MED ORDER — DOCUSATE SODIUM 50 MG/5ML PO LIQD: 100.0000 mg | Freq: Two times a day (BID) | ORAL | Status: DC | PRN

## 2017-05-07 MED ORDER — ORAL CARE MOUTH RINSE
15.0000 mL | OROMUCOSAL | Status: DC
  Administered 2017-05-07 (×5): 15 mL via OROMUCOSAL

## 2017-05-07 MED ORDER — POTASSIUM CHLORIDE 10 MEQ/100ML IV SOLN
10.0000 meq | INTRAVENOUS | Status: AC
  Administered 2017-05-07 (×4): 10 meq via INTRAVENOUS
  Filled 2017-05-07 (×4): qty 100

## 2017-05-07 MED ORDER — INSULIN ASPART 100 UNIT/ML ~~LOC~~ SOLN
3.0000 [IU] | SUBCUTANEOUS | Status: DC
  Administered 2017-05-07 (×2): 6 [IU] via SUBCUTANEOUS
  Administered 2017-05-08: 9 [IU] via SUBCUTANEOUS

## 2017-05-07 MED ORDER — SUCCINYLCHOLINE CHLORIDE 20 MG/ML IJ SOLN
INTRAMUSCULAR | Status: AC | PRN
  Administered 2017-05-07: 100 mg via INTRAVENOUS

## 2017-05-07 MED ORDER — FENTANYL CITRATE (PF) 100 MCG/2ML IJ SOLN
50.0000 ug | Freq: Once | INTRAMUSCULAR | Status: AC
  Administered 2017-05-07: 50 ug via INTRAVENOUS

## 2017-05-07 MED ORDER — PANTOPRAZOLE SODIUM 40 MG PO TBEC
40.0000 mg | DELAYED_RELEASE_TABLET | Freq: Every day | ORAL | Status: DC
  Administered 2017-05-08 – 2017-05-09 (×2): 40 mg via ORAL
  Filled 2017-05-07 (×2): qty 1

## 2017-05-07 MED ORDER — PROPOFOL 1000 MG/100ML IV EMUL
5.0000 ug/kg/min | Freq: Once | INTRAVENOUS | Status: AC
  Administered 2017-05-07: 20 ug/kg/min via INTRAVENOUS

## 2017-05-07 MED ORDER — FLUTICASONE PROPIONATE 50 MCG/ACT NA SUSP
1.0000 | Freq: Every day | NASAL | Status: DC
  Administered 2017-05-07 – 2017-05-09 (×3): 1 via NASAL
  Filled 2017-05-07: qty 16

## 2017-05-07 MED ORDER — BISACODYL 10 MG RE SUPP: 10.0000 mg | Freq: Every day | RECTAL | Status: DC | PRN

## 2017-05-07 MED ORDER — MIDAZOLAM HCL 2 MG/2ML IJ SOLN
2.0000 mg | INTRAMUSCULAR | Status: DC | PRN
  Administered 2017-05-07: 2 mg via INTRAVENOUS
  Filled 2017-05-07: qty 2

## 2017-05-07 MED ORDER — ARFORMOTEROL TARTRATE 15 MCG/2ML IN NEBU
15.0000 ug | INHALATION_SOLUTION | Freq: Two times a day (BID) | RESPIRATORY_TRACT | Status: DC
  Administered 2017-05-07 – 2017-05-08 (×3): 15 ug via RESPIRATORY_TRACT
  Filled 2017-05-07 (×3): qty 2

## 2017-05-07 MED ORDER — MIDAZOLAM HCL 2 MG/2ML IJ SOLN: 2.0000 mg | INTRAMUSCULAR | Status: DC | PRN

## 2017-05-07 MED ORDER — IPRATROPIUM-ALBUTEROL 0.5-2.5 (3) MG/3ML IN SOLN: 3.0000 mL | Freq: Four times a day (QID) | RESPIRATORY_TRACT | Status: DC

## 2017-05-07 MED ORDER — VITAMIN B-12 1000 MCG PO TABS
500.0000 ug | ORAL_TABLET | Freq: Two times a day (BID) | ORAL | Status: DC
  Administered 2017-05-07 – 2017-05-09 (×4): 500 ug via ORAL
  Filled 2017-05-07 (×6): qty 1

## 2017-05-07 MED ORDER — VANCOMYCIN HCL IN DEXTROSE 1-5 GM/200ML-% IV SOLN
1000.0000 mg | INTRAVENOUS | Status: DC
  Administered 2017-05-07: 1000 mg via INTRAVENOUS
  Filled 2017-05-07 (×2): qty 200

## 2017-05-07 MED ORDER — PIPERACILLIN-TAZOBACTAM 3.375 G IVPB 30 MIN
3.3750 g | Freq: Once | INTRAVENOUS | Status: AC
  Administered 2017-05-07: 3.375 g via INTRAVENOUS
  Filled 2017-05-07: qty 50

## 2017-05-07 MED ORDER — NALOXONE HCL 2 MG/2ML IJ SOSY
1.0000 mg | PREFILLED_SYRINGE | Freq: Once | INTRAMUSCULAR | Status: AC
  Administered 2017-05-07: 1 mg via INTRAVENOUS
  Filled 2017-05-07: qty 2

## 2017-05-07 MED ORDER — INSULIN ASPART 100 UNIT/ML ~~LOC~~ SOLN
2.0000 [IU] | SUBCUTANEOUS | Status: DC
  Administered 2017-05-07 (×2): 6 [IU] via SUBCUTANEOUS

## 2017-05-07 MED ORDER — NALOXONE HCL 2 MG/2ML IJ SOSY: 1.0000 mg | PREFILLED_SYRINGE | Freq: Once | INTRAMUSCULAR | Status: DC

## 2017-05-07 MED ORDER — FENTANYL BOLUS VIA INFUSION
50.0000 ug | INTRAVENOUS | Status: DC | PRN
  Administered 2017-05-07 (×2): 50 ug via INTRAVENOUS
  Filled 2017-05-07: qty 50

## 2017-05-07 MED ORDER — CLOPIDOGREL BISULFATE 75 MG PO TABS
75.0000 mg | ORAL_TABLET | Freq: Every day | ORAL | Status: DC
  Administered 2017-05-07 – 2017-05-08 (×2): 75 mg via ORAL
  Filled 2017-05-07 (×2): qty 1

## 2017-05-07 MED ORDER — BUDESONIDE 0.5 MG/2ML IN SUSP
0.5000 mg | Freq: Two times a day (BID) | RESPIRATORY_TRACT | Status: DC
  Administered 2017-05-07 – 2017-05-08 (×3): 0.5 mg via RESPIRATORY_TRACT
  Filled 2017-05-07 (×3): qty 2

## 2017-05-07 MED ORDER — FENTANYL CITRATE (PF) 100 MCG/2ML IJ SOLN
INTRAMUSCULAR | Status: AC
  Administered 2017-05-07: 50 ug via INTRAVENOUS
  Filled 2017-05-07: qty 2

## 2017-05-07 MED ORDER — FAMOTIDINE 40 MG/5ML PO SUSR
20.0000 mg | Freq: Two times a day (BID) | ORAL | Status: DC
  Administered 2017-05-07: 20 mg
  Filled 2017-05-07 (×2): qty 2.5

## 2017-05-07 MED ORDER — SODIUM CHLORIDE 0.9 % IV SOLN: 250.0000 mL | INTRAVENOUS | Status: DC | PRN

## 2017-05-07 MED ORDER — CHLORHEXIDINE GLUCONATE 0.12% ORAL RINSE (MEDLINE KIT)
15.0000 mL | Freq: Two times a day (BID) | OROMUCOSAL | Status: DC
  Administered 2017-05-07: 15 mL via OROMUCOSAL

## 2017-05-07 MED ORDER — ALBUTEROL SULFATE (2.5 MG/3ML) 0.083% IN NEBU: 2.5000 mg | INHALATION_SOLUTION | Freq: Four times a day (QID) | RESPIRATORY_TRACT | Status: DC | PRN

## 2017-05-07 MED ORDER — MONTELUKAST SODIUM 10 MG PO TABS
10.0000 mg | ORAL_TABLET | Freq: Every day | ORAL | Status: DC
  Administered 2017-05-07 – 2017-05-08 (×2): 10 mg via ORAL
  Filled 2017-05-07 (×2): qty 1

## 2017-05-07 MED ORDER — DEXMEDETOMIDINE HCL IN NACL 200 MCG/50ML IV SOLN: 0.4000 ug/kg/h | INTRAVENOUS | Status: DC

## 2017-05-07 MED ORDER — TIOTROPIUM BROMIDE MONOHYDRATE 18 MCG IN CAPS
18.0000 ug | ORAL_CAPSULE | Freq: Every day | RESPIRATORY_TRACT | Status: DC
  Administered 2017-05-08 – 2017-05-09 (×2): 18 ug via RESPIRATORY_TRACT
  Filled 2017-05-07: qty 5

## 2017-05-07 MED ORDER — SODIUM CHLORIDE 0.9 % IV SOLN
INTRAVENOUS | Status: DC
  Administered 2017-05-07 (×3): via INTRAVENOUS

## 2017-05-07 MED ORDER — PRO-STAT SUGAR FREE PO LIQD: 30.0000 mL | Freq: Two times a day (BID) | ORAL | Status: DC

## 2017-05-07 MED ORDER — DULOXETINE HCL 30 MG PO CPEP
30.0000 mg | ORAL_CAPSULE | Freq: Every day | ORAL | Status: DC
  Administered 2017-05-07 – 2017-05-09 (×3): 30 mg via ORAL
  Filled 2017-05-07 (×4): qty 1

## 2017-05-07 MED ORDER — ETOMIDATE 2 MG/ML IV SOLN
INTRAVENOUS | Status: AC | PRN
  Administered 2017-05-07: 30 mg via INTRAVENOUS

## 2017-05-07 MED ORDER — DOCUSATE SODIUM 50 MG/5ML PO LIQD
100.0000 mg | Freq: Two times a day (BID) | ORAL | Status: DC
  Administered 2017-05-07: 100 mg
  Filled 2017-05-07: qty 10

## 2017-05-07 MED ORDER — HEPARIN SODIUM (PORCINE) 5000 UNIT/ML IJ SOLN
5000.0000 [IU] | Freq: Three times a day (TID) | INTRAMUSCULAR | Status: DC
  Administered 2017-05-07 – 2017-05-09 (×6): 5000 [IU] via SUBCUTANEOUS
  Filled 2017-05-07 (×6): qty 1

## 2017-05-07 MED ORDER — NALOXONE HCL 0.4 MG/ML IJ SOLN
0.4000 mg | Freq: Once | INTRAMUSCULAR | Status: AC
  Administered 2017-05-07: 0.4 mg via INTRAVENOUS
  Filled 2017-05-07: qty 1

## 2017-05-07 MED ORDER — FENTANYL 2500MCG IN NS 250ML (10MCG/ML) PREMIX INFUSION
25.0000 ug/h | INTRAVENOUS | Status: DC
  Administered 2017-05-07: 200 ug/h via INTRAVENOUS
  Administered 2017-05-07: 50 ug/h via INTRAVENOUS
  Filled 2017-05-07 (×2): qty 250

## 2017-05-07 MED ORDER — PIPERACILLIN-TAZOBACTAM 3.375 G IVPB
3.3750 g | Freq: Three times a day (TID) | INTRAVENOUS | Status: DC
  Administered 2017-05-07: 3.375 g via INTRAVENOUS
  Filled 2017-05-07 (×3): qty 50

## 2017-05-07 MED ORDER — DOCUSATE SODIUM 100 MG PO CAPS
100.0000 mg | ORAL_CAPSULE | Freq: Two times a day (BID) | ORAL | Status: DC
Start: 2017-05-07 — End: 2017-05-09
  Administered 2017-05-07 – 2017-05-09 (×4): 100 mg via ORAL
  Filled 2017-05-07 (×4): qty 1

## 2017-05-07 MED ORDER — HYDROMORPHONE HCL 2 MG PO TABS
4.0000 mg | ORAL_TABLET | Freq: Four times a day (QID) | ORAL | Status: DC | PRN
  Administered 2017-05-08: 2 mg via ORAL
  Filled 2017-05-07: qty 2

## 2017-05-07 MED ORDER — ONDANSETRON HCL 4 MG/2ML IJ SOLN: 4.0000 mg | Freq: Four times a day (QID) | INTRAMUSCULAR | Status: DC | PRN

## 2017-05-07 MED ORDER — ASPIRIN EC 81 MG PO TBEC
81.0000 mg | DELAYED_RELEASE_TABLET | Freq: Every day | ORAL | Status: DC
  Administered 2017-05-07 – 2017-05-09 (×3): 81 mg via ORAL
  Filled 2017-05-07 (×3): qty 1

## 2017-05-07 NOTE — Procedures (Signed)
Extubation Procedure Note  Patient Details:   Name: Manuel Baxter DOB: 08-20-1955 MRN: [id number]   Airway Documentation: Pt awake, alert and oriented extubated to 2 lpm [place] sat 96%.  Pt had good strong cough able to speak and answer questions.  Will continue to monitor.    Evaluation  O2 sats: stable throughout Complications: No apparent complications Patient did tolerate procedure well. Bilateral Breath Sounds: Diminished   Yes  Ned Grace 05/07/2017, 5:55 PM

## 2017-05-07 NOTE — Progress Notes (Signed)
PULMONARY / CRITICAL CARE MEDICINE   Name: Manuel Baxter MRN: [id number] DOB: 1955-12-22    ADMISSION DATE:  05/06/2017 CONSULTATION DATE:  05/07/2017  REFERRING MD:  Dr. 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     Per wife, patient was in his usual state of health until yesterday (4/3) when she noticed patient appeared confused and somnolent. He continued to worsened throughout the day and became unresponsive which prompted them to call EMS. Wife reports patient's arm were shaking yesterday. States this is not new, but appeared to worsen. She also notes one episode of urine incontinence which is not normal for him, but unclear if this occurred while patient's arm were shaking.  Patient did not voice any complaints prior to his acute change in mentation. He has a productive cough at baseline for the past 5 years.  He has been hospitalized 4 times since 01/2017 for COPD exacerbation. He is on PO Dilaudid for chronic back pain and per wife usually takes extra doses. She arranges his medications in a pill box and has noticed that he takes additional doses of several medications when he is not feeling well including Dilaudid and antihypertensives. He has a CPAP at home that he is not compliant with. He also continues to smoke ~ 0.5ppd or more.    Recently admitted 3/22-3/26 for COPD exacerbation and treated with doxycycline x5 days, prednisone x 5 days, and SABA, ICS, SAMA, and LAMA. He was recommended SNF on discharge but declined.   In the ED, he was noted to be lethargic. He received a total of 1.8mg  of Narcan with noted agitation afterwards. However, he continued to  become somnolent and lethargic and decision was made to intubate patient as he was unable to protect his airway. Significant oral secretions noted at the time of intubation. Per RN, he had diffusely decreased breath sounds on presentations without wheezes or crackles.   SUBJECTIVE:  Acutely agitated, hitting bed rails, this am requiring increasing sedation Fentanyl at 250 mcg/hr CT head neg for acute events  VITAL SIGNS: BP (!) 88/48   Pulse 78   Temp 99.1 F (37.3 C)   Resp 16   Ht 5\' 7"  (1.702 m)   Wt 163 lb 12.8 oz (74.3 kg)   SpO2 100%   BMI 25.66 kg/m   HEMODYNAMICS:    VENTILATOR SETTINGS: Vent Mode: PRVC FiO2 (%):  [40 %-100 %] 40 % Set Rate:  [15 bmp-18 bmp] 15 bmp Vt Set:  [490 mL-530 mL] 490 mL PEEP:  [5 cmH20] 5 cmH20 Plateau Pressure:  [14 cmH20-17 cmH20] 15 cmH20  INTAKE / OUTPUT: I/O last 3 completed shifts: In: 526.1 [I.V.:26.1; IV Piggyback:500] Out: 500 [Urine:500]  PHYSICAL EXAMINATION: General:  Older adult male sedated on MV in NAD HEENT: MM pink/moist, ETT/ OGT, pupils 3/reactive, no JVD Neuro: sedated, no f/c CV: s1s2 rrr PULM: even/non-labored, lungs bilaterally clear, slightly diminished RUL, no wheezes GI: soft, non-tender, bs hypo active  Extremities: cool/dry, no BLE edema  Skin: bruising to BLE  LABS:  BMET Recent Labs  Lab 05/06/17 2328 05/07/17 0439  NA 130* 130*  K 3.2* 3.2*  CL 81*  87*  CO2 35* 30  BUN 59* 59*  CREATININE 2.13* 1.97*  GLUCOSE 169* 239*    Electrolytes Recent Labs  Lab 05/06/17 2328 05/07/17 0439  CALCIUM 10.2 9.1  MG  --  1.9  PHOS  --  3.3    CBC Recent Labs  Lab 05/06/17 2328 05/07/17 0439  WBC 19.5* 11.3*  HGB 11.3* 9.4*  HCT 36.8* 30.7*  PLT 253 190    Coag's No results for input(s): APTT, INR in the last 168 hours.  Sepsis Markers Recent Labs  Lab 05/07/17 0207 05/07/17 0439 05/07/17 0735  LATICACIDVEN 1.86 1.9 2.8*  PROCALCITON  --  0.17  --     ABG Recent Labs  Lab  05/07/17 0001 05/07/17 0309  PHART 7.412 7.421  PCO2ART 58.2* 60.7*  PO2ART 65.0* 460.0*    Liver Enzymes Recent Labs  Lab 05/06/17 2328  AST 36  ALT 30  ALKPHOS 130*  BILITOT 0.7  ALBUMIN 3.5    Cardiac Enzymes No results for input(s): TROPONINI, PROBNP in the last 168 hours.  Glucose Recent Labs  Lab 05/06/17 2337 05/07/17 0841 05/07/17 1221  GLUCAP 169* 223* 217*    Imaging Ct Head Wo Contrast  Result Date: 05/07/2017 CLINICAL DATA:  Altered level of consciousness. History of lung cancer. EXAM: CT HEAD WITHOUT CONTRAST TECHNIQUE: Contiguous axial images were obtained from the base of the skull through the vertex without intravenous contrast. COMPARISON:  None. FINDINGS: Brain: Sequela of prior right posterior parietal/occipital subcortical infarct with associated encephalomalacia (image 22, series 3, sagittal image 21, series 6). Scattered periventricular hypodensities compatible with microvascular ischemic disease. Mild atrophy with sulcal prominence centralized volume loss with commensurate ex vacuo dilatation of the ventricular system. No CT evidence of acute large territory infarct. No intraparenchymal or extra-axial mass or hemorrhage. Normal configuration of the ventricles and the basilar cisterns. No midline shift. Vascular: Intracranial atherosclerosis. Skull: No displaced calvarial fracture. There is underpneumatization of the bilateral Sinuses/Orbits: There is underpneumatization of the bilateral frontal sinuses. Mucosal thickening involving the bilateral anterior and posterior ethmoidal air cells, the bilateral sphenoid sinuses and the right maxillary sinus. Scattered opacification of the left mastoid air cells. No air-fluid levels. Other: Regional soft tissues appear normal. IMPRESSION: 1. 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 2. Sinus disease as above.  No air-fluid levels.  Electronically Signed   By: Sandi Mariscal M.D.   On: 05/07/2017 12:03   US Renal  Result Date: 05/07/2017 CLINICAL DATA:  Acute renal injury EXAM: RENAL / URINARY TRACT ULTRASOUND COMPLETE COMPARISON:  None. FINDINGS: Right Kidney: Length: 9.2 cm. Echogenicity within normal limits. There is renal cortical thinning. No mass, perinephric fluid, or hydronephrosis visualized. No sonographically demonstrable calculus or ureterectasis. Left Kidney: Length: 9.7 cm. Echogenicity within normal limits. There is renal cortical thinning. No mass, perinephric fluid, or hydronephrosis visualized. No sonographically demonstrable calculus or ureterectasis. Bladder: Appears normal for degree of bladder distention. A Foley catheter is positioned within the urinary bladder. IMPRESSION: Renal cortical thinning bilaterally, a finding that may be seen with medical renal disease. The renal echogenicity is normal. No obstructing focus in either kidney. Electronically Signed   By: Lowella Grip III M.D.   On: 05/07/2017 09:27   Dg Chest Portable 1 View  Result Date: 05/07/2017 CLINICAL DATA:  Post intubation EXAM: PORTABLE CHEST 1 VIEW COMPARISON:  05/06/2017, 04/26/2017 FINDINGS: Endotracheal tube tip is approximately 4.9 cm superior to the carina. Esophageal tube tip  extends below the diaphragm but is not included. Emphysematous disease with linear scar or atelectasis in the right lower lung. Architectural distortion in the right hilus. Stable cardiomediastinal silhouette. No pneumothorax. IMPRESSION: 1. Endotracheal tube tip about 4.9 cm superior to carina 2. Otherwise no significant interval change in emphysematous disease and spiculated opacity in the right hilar region Electronically Signed   By: Donavan Foil M.D.   On: 05/07/2017 02:37   Dg Chest Port 1 View  Result Date: 05/07/2017 CLINICAL DATA:  Shortness of breath with fever EXAM: PORTABLE CHEST 1 VIEW COMPARISON:  04/26/2017, 04/24/2017, 02/06/2017, 01/15/2017, CT  chest 01/14/2017 FINDINGS: Hyperinflation with emphysematous disease. Architectural distortion in the right hilar region. No acute consolidation or pleural effusion. Stable heart size. Aortic atherosclerosis. No pneumothorax. IMPRESSION: 1. Hyperinflation with emphysematous disease 2. Architectural distortion/spiculated opacity in the right hilar region, corresponding to the potential mass on prior chest CT. 3. No acute focal opacity. Electronically Signed   By: Donavan Foil M.D.   On: 05/07/2017 00:09   Dg Abd Portable 1v  Result Date: 05/07/2017 CLINICAL DATA:  Orogastric tube placement EXAM: PORTABLE ABDOMEN - 1 VIEW COMPARISON:  None. FINDINGS: Orogastric tube side port is just beyond the gastroesophageal junction. Recommended Vance min by another 5 cm. Lung bases are clear. IMPRESSION: Orogastric tube side port just beyond the gastroesophageal junction. This could be further advanced by 5 cm. Electronically Signed   By: Ulyses Jarred M.D.   On: 05/07/2017 06:20   STUDIES:  CXR pre-intubation: hyperinflation with emphysematous changes, spiculated opacity at R hilum, no focal opacity  CXR post-intubation: no changes other than appropriately placed ET tube  4/4 CTH >> 1. 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 2. Sinus disease; No air-fluid levels. 4/4 Renal US >> Renal cortical thinning bilaterally, a finding that may be seen with medical renal disease. The renal echogenicity is normal. No obstructing focus in either kidney.  CULTURES: 4/4 Bcx sent >>  4/4 sputum cx >> 4/4 trach asp >> 4/4 RVP >> + coronavirus 229e (previously +coronavirus OC43 02/2017) 4/4 MRSA PCR >> neg  ANTIBIOTICS: Levofloxacin 4/4 >> 4/4 Vancomycin 4/4 >>  4/4 Zosyn 4/4 >> 4/4  SIGNIFICANT EVENTS: 4/4  Admit  LINES/TUBES: PIV LUE + RUE  4/4 ETT >> 4/4 Foley catheter >>  ASSESSMENT / PLAN:  PULMONARY A: Acute on  chronic hypoxic hypercapnic respiratory failure likely multifactorial from end-stage COPD, +/- aspiration, +/- opioid overdose  Severe COPD (FEV1 23%) on chronic O2 at home, 3L  Stage I NSCLC s/p radiation 2016 OSA  Aspiration event/ PNA P:   Wean sedation for SBT now, to assess for possible extubation Otherwise, continue full MV support, 8 cc/kg Continue brovana, pulmicort, atrovent duonebs q 6hrs No need for systemic steroids See ID  Daily SBT  CARDIOVASCULAR A:  Hypotension- likely related to sedation Hx of HFpEF (TTE 02/2017 with EF 55%, mild LVH, and G1DD. No wall motion abnormalities), HTN  P:  Tele monitoring Goal MAP > 65 Holding diuresis for now Trend lactate Resume ASA, plavix  Holding pre-admit atenolol  RENAL A:   AKI with Scr 2.1, normal renal function at baseline   HTN  - Renal US neg P:   NS 75 ml/hr Trend BMP /mag/ phos/ urinary output Replace electrolytes as indicated Avoid nephrotoxic agents, ensure adequate renal perfusion   GASTROINTESTINAL A:   Hx GERD, Liver cirrhosis, chronic Constipation (Opioid induced) - ammonia 28 P:  NPO  Start TF if remains intubated Famotidine BID for SUP Colace per tube BID  dulcolax prn   HEMATOLOGIC  Anemia - stable P:  SQ heparin TID for VTE ppx   INFECTIOUS A:   Leukocytosis, ?reactive, afebrile with no focal consolidation but concern for aspiration event given significant oral secretions post-intubation  - ua neg P:   D/c all abx with reassuring neg pct Monitor for developing possible aspiration but will hold abx, follow sputum cxs Trend WBC/ fever curve   ENDOCRINE A:   T2DM  P:   SSI resistant scale CBG monitoring q 4   NEUROLOGIC A:   Acute encephalopathy-likely related to Misuse of home meds  Chronic back pain on chronic opiate therapy  P:   Intubated and sedated with RASS goal: 0-1 Fentanyl gtt wean Add precedex to facilitate SBT  CCT 60 mins  Kennieth Rad, AGACNP-BC [place]  [facility] Pgr: [phone] or if no answer [phone] 05/07/2017, 3:41 PM

## 2017-05-07 NOTE — H&P (Signed)
H&P performed by NP Simpson on 4/4 and titled Progress note.

## 2017-05-07 NOTE — Consult Note (Signed)
PULMONARY / CRITICAL CARE MEDICINE   Name: Manuel Baxter MRN: [id number] DOB: September 24, 1955    ADMISSION DATE:  05/06/2017 CONSULTATION DATE:  05/07/2017  REFERRING MD:  Dr. 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  Patient unable to provide further history as he was intubated at the time of evaluation. Family at bedside. Per wife, patient was in his usual state of health until yesterday (4/3) when she noticed patient appeared confused and somnolent. He continued to worsened throughout the day and became unresponsive which prompted them to call EMS. Wife reports patient's arm were shaking yesterday. States this is not new, but appeared to worsen. She also notes one episode of urine incontinence which is not normal for him, but unclear if this occurred while patient's arm were shaking.  Patient did not voice any complaints prior to his acute change in mentation. He has a productive cough at baseline for the past 5 years.  He has been hospitalized 4 times since 01/2017 for COPD exacerbation. He is on PO Dilaudid for chronic back pain and per wife usually takes extra doses. She arranges his medications in a pill box and has noticed that he takes additional doses of several medications when he is not feeling well including Dilaudid and antihypertensives. He has a CPAP at home that he is not compliant with. He also continues to smoke ~ 0.5ppd or more.    Recently admitted for COPD exacerbation and treated with doxycycline x5 days, prednisone x 5 days, and SABA, ICS, SAMA, and LAMA. He was recommended SNF on discharge but  declined.   In the ED, he was noted to be lethargic. He received a total of 1.8mg  of Narcan with noted agitation afterwards. However, he continued to become somnolent and lethargic and decision was made to intubate patient as he was unable to protect his airway. Significant oral secretions noted at the time of intubation. Per RN, he had diffusely decreased breath sounds on presentations without wheezes or crackles.   PAST MEDICAL HISTORY :  He  has a past medical history of COPD (chronic obstructive pulmonary disease) ([place]) and Lung cancer ([place]).  PAST SURGICAL HISTORY: He  has no past surgical history on file.  Allergies  Allergen Reactions  . Doxepin Anaphylaxis  . Lisinopril Anaphylaxis  . Acetaminophen Other (See Comments)    Elevates liver enzymes,has hx of cirrhosis ( non-alcoholic )  . Contrast Media [Iodinated Diagnostic Agents] Other (See Comments)    Stopped breathing  . Gabapentin Other (See Comments)    Tremors, loopy  . Lyrica [Pregabalin] Other (See Comments)    nightmares  . Prozac [Fluoxetine Hcl] Other (See Comments)    Makes him mean    No current facility-administered medications on file prior to encounter.    Current Outpatient Medications on File Prior to Encounter  Medication Sig  . albuterol (PROAIR HFA) 108 (90 Base) MCG/ACT inhaler Inhale 2 puffs into the lungs every 6 (six) hours as needed for wheezing or shortness of breath.  . Alum Hydroxide-Mag Carbonate (GAVISCON PO) Take 1 tablet by mouth daily as needed (indigestion/heartburn).  Marland Kitchen aspirin  EC 81 MG tablet Take 81 mg by mouth daily.  . Aspirin-Caffeine (BC FAST PAIN RELIEF ARTHRITIS) 1000-65 MG PACK Take 2 packets by mouth every 4 (four) hours as needed (pain/headache).  Marland Kitchen atenolol (TENORMIN) 50 MG tablet Take 50 mg by mouth 2 (two) times daily.  . Cholecalciferol (VITAMIN D) 2000 units tablet Take 2,000 Units by mouth 2 (two) times daily.  . clopidogrel (PLAVIX) 75 MG tablet Take 75 mg by mouth daily  with supper.  . cyanocobalamin 500 MCG tablet Take 500 mcg by mouth 2 (two) times daily. Vitamin B12  . DULoxetine (CYMBALTA) 30 MG capsule Take 30 mg by mouth daily.  . fluticasone (FLONASE) 50 MCG/ACT nasal spray Place 1 spray into both nostrils daily.  . furosemide (LASIX) 40 MG tablet Take 1 tablet (40 mg total) by mouth daily.  Marland Kitchen glipiZIDE (GLUCOTROL) 5 MG tablet Take 2.5 mg by mouth 2 (two) times daily before a meal.  . HYDROmorphone (DILAUDID) 4 MG tablet Take 4 mg by mouth every 6 (six) hours as needed (chronic pain).  Marland Kitchen ipratropium-albuterol (DUONEB) 0.5-2.5 (3) MG/3ML SOLN Take 3 mLs by nebulization every 6 (six) hours.  Marland Kitchen loratadine (CLARITIN) 10 MG tablet Take 10 mg by mouth daily.  . montelukast (SINGULAIR) 10 MG tablet Take 10 mg by mouth daily with supper.  . Omega-3 Fatty Acids (FISH OIL) 1000 MG CAPS Take 1,000-2,000 mg by mouth See admin instructions. Take 2 capsules (2000 mg) by mouth every morning and 1 capsule (1000 mg) before supper  . omeprazole (PRILOSEC) 20 MG capsule Take 20 mg by mouth daily.  Marland Kitchen OVER THE COUNTER MEDICATION Place 0.5-1 mLs under the tongue See admin instructions. [employer] - place 1/2 dropperful (0.5 ml) under the tongue twice during the day, place 1 dropperful (1 ml) under the tongue at bedtime. 1 ml - 250 mg hemp, 405 mg linoleic acid, 135 mg Alpha Linoleic Acid, 90 m Oleic Acid.  . phenylephrine (SUDAFED PE) 10 MG TABS tablet Take 10 mg by mouth every 4 (four) hours as needed (congestion).  Marland Kitchen tiotropium (SPIRIVA) 18 MCG inhalation capsule Place 18 mcg into inhaler and inhale daily.     FAMILY HISTORY:  His indicated that the status of his other is unknown.   SOCIAL HISTORY: He  reports that he has been smoking.  He has never used smokeless tobacco. He reports that he does not drink alcohol or use drugs.  REVIEW OF SYSTEMS:   + Dyspnea, productive cough, back pain (chronic), constipation  No fever or diaphoresis, HA, chest pain,  abdominal pain, urinary symptoms, diarrhea    SUBJECTIVE: Patient is intubated and sedated.   VITAL SIGNS: BP 103/69   Pulse 95   Temp 99.1 F (37.3 C) (Rectal)   Resp (!) 21   Ht 5\' 7"  (1.702 m)   Wt 166 lb (75.3 kg)   SpO2 100%   BMI 26.00 kg/m   HEMODYNAMICS:    VENTILATOR SETTINGS: Vent Mode: PRVC FiO2 (%):  [100 %] 100 % Set Rate:  [18 bmp] 18 bmp Vt Set:  [530 mL] 530 mL PEEP:  [5 cmH20] 5 cmH20 Plateau Pressure:  [14 cmH20] 14 cmH20  INTAKE / OUTPUT: No intake/output data recorded.  PHYSICAL EXAMINATION: General:  Chronically-ill appearing male who appears older than stated age   Neuro:  Sedated  HEENT:  Significant amount of oral secretions noted  Cardiovascular:  RRR, no mrg, no JVD  Lungs:  Intubated, mechanical breath sounds, diffusely diminished, no  wheezes or crackles  Abdomen:  Soft, NTND, normoactive bowel sounds  Musculoskeletal:  No lower extremity edema  Skin:  Erythematous rash on plantar surface of bilateral feet L>R, non-blanchable purpura over bilateral upper extremities   LABS:  BMET Recent Labs  Lab 05/06/17 2328  NA 130*  K 3.2*  CL 81*  CO2 35*  BUN 59*  CREATININE 2.13*  GLUCOSE 169*    Electrolytes Recent Labs  Lab 05/06/17 2328  CALCIUM 10.2    CBC Recent Labs  Lab 05/06/17 2328  WBC 19.5*  HGB 11.3*  HCT 36.8*  PLT 253    Coag's No results for input(s): APTT, INR in the last 168 hours.  Sepsis Markers Recent Labs  Lab 05/07/17 0021 05/07/17 0207  LATICACIDVEN 1.68 1.86    ABG Recent Labs  Lab 05/07/17 0001  PHART 7.412  PCO2ART 58.2*  PO2ART 65.0*    Liver Enzymes Recent Labs  Lab 05/06/17 2328  AST 36  ALT 30  ALKPHOS 130*  BILITOT 0.7  ALBUMIN 3.5    Cardiac Enzymes No results for input(s): TROPONINI, PROBNP in the last 168 hours.  Glucose Recent Labs  Lab 05/06/17 2337  GLUCAP 169*    Imaging Dg Chest Portable 1 View  Result Date: 05/07/2017 CLINICAL DATA:  Post  intubation EXAM: PORTABLE CHEST 1 VIEW COMPARISON:  05/06/2017, 04/26/2017 FINDINGS: Endotracheal tube tip is approximately 4.9 cm superior to the carina. Esophageal tube tip extends below the diaphragm but is not included. Emphysematous disease with linear scar or atelectasis in the right lower lung. Architectural distortion in the right hilus. Stable cardiomediastinal silhouette. No pneumothorax. IMPRESSION: 1. Endotracheal tube tip about 4.9 cm superior to carina 2. Otherwise no significant interval change in emphysematous disease and spiculated opacity in the right hilar region Electronically Signed   By: Donavan Foil M.D.   On: 05/07/2017 02:37   Dg Chest Port 1 View  Result Date: 05/07/2017 CLINICAL DATA:  Shortness of breath with fever EXAM: PORTABLE CHEST 1 VIEW COMPARISON:  04/26/2017, 04/24/2017, 02/06/2017, 01/15/2017, CT chest 01/14/2017 FINDINGS: Hyperinflation with emphysematous disease. Architectural distortion in the right hilar region. No acute consolidation or pleural effusion. Stable heart size. Aortic atherosclerosis. No pneumothorax. IMPRESSION: 1. Hyperinflation with emphysematous disease 2. Architectural distortion/spiculated opacity in the right hilar region, corresponding to the potential mass on prior chest CT. 3. No acute focal opacity. Electronically Signed   By: Donavan Foil M.D.   On: 05/07/2017 00:09     STUDIES:  CXR pre-intubation: hyperinflation with emphysematous changes, spiculated opacity at R hilum, no focal opacity  CXR post-intubation: no changes other than appropriately placed ET tube   CULTURES: Bcx sent 4/4  ANTIBIOTICS: Levofloxacin 4/4 Vancomycin 4/4- Zosyn 4/4-  SIGNIFICANT EVENTS: Sedated and intubated in the ED 4/4  LINES/TUBES: PIV LUE + RUE  ET tube  Foley catheter placed 4/4   ASSESSMENT / PLAN:  PULMONARY A: Acute hypoxic respiratory failure likely multifactorial from end-stage COPD, aspiration, and opioid overdose  Severe COPD  (FEV1 23%) on chronic O2 at home, 3L  Stage I NSCLC s/p radiation 2016 OSA  Aspiration event/ PNA P:   - Intubated and Sedated  - Start empiric coverage with Vancomycin and Zosyn  - Brovana, Pulmicort, Atrovent  - Scheduled duonebs q6h  - S/p IV solumedrol x1  - Flu negative  - Follow up ABG  - Follow up RVP, respiratory and blood cultures, procalcitonin  - Trending lactic acid  - Maintain MAP > 65  CARDIOVASCULAR  A:  CAD  HFpEF  P:  - TTE 02/2017 with EF 55%, mild LVH, and G1DD. No wall motion abnormalities  - Euvolemic on exam. Will monitor volume status in setting of IVF hydration  - Tele monitoring  - On PO Lasix at home, will hold   RENAL A:   AKI with Scr 2.1, normal renal function at baseline   HTN  P:   - Hold home BP meds  - UA clean  - NS @ 125cc/hr  - Follow up urine studies and BMP  - Monitoring electrolytes and urine output  - Maintain MAP > 65   GASTROINTESTINAL A:   GERD  Liver cirrhosis  Constipation  P:   - NPO  - Famotidine BID per tube  - IV Zofran PRN for nausea  - Colace per tube BID   HEMATOLOGIC  P:  SQ heparin TID for VTE ppx   INFECTIOUS A:   Leukocytosis, afebrile with no focal consolidation but concern for aspiration event given significant oral secretions post-intubation  P:   - S/p Levaquin x1  - Start empiric coverage with Vancomycin and Zosyn  - Follow up RVP, respiratory and blood cultures, procalcitonin  - Trend Wbc - Monitor fever curve   ENDOCRINE A:   T2DM  P:   - Novolog 2-6 q4h standard scale  - CBG monitoring   NEUROLOGIC A:   Chronic back pain on chronic opiate therapy  P:   - Intubated and sedated with RASS goal: 0-1 - Fentanyl gtt  - Propofol d/c due to hypotension    Welford [facility], PGY1 05/07/2017, 2:44 AM

## 2017-05-07 NOTE — Progress Notes (Signed)
Transported pt from D pod 35 to 4N ICU 32 with RN at bedside and without any complications.

## 2017-05-07 NOTE — Progress Notes (Signed)
CRITICAL VALUE ALERT  Critical Value:  Lactic Acid 2.8   Date & Time Notied:  05/07/17 0910   Provider Notified: CCM MD Notified  Orders Received/Actions taken: No orders at this time

## 2017-05-07 NOTE — Progress Notes (Signed)
175 mL Fentanyl wasted in sink with Dutch Quint, RN

## 2017-05-07 NOTE — Progress Notes (Signed)
Pt transported to and from CT scan on 100% FIO2 and full support.  Pt stable and returned to current vent settings upon arrival back to room.

## 2017-05-07 NOTE — Progress Notes (Signed)
Pharmacy Antibiotic Note  Manuel Baxter is a 62 y.o. male admitted on 05/06/2017 with sepsis.  Pharmacy has been consulted for Vancomycin/Zosyn dosing. WBC is elevated. Noted renal dysfunction. CXR unremarkable.   Plan: Vancomycin 1000 mg IV q24h Zosyn 3.375G IV q8h to be infused over 4 hours Trend WBC, temp, renal function  F/U infectious work-up Drug levels as indicated   Height: 5\' 7"  (170.2 cm) Weight: 166 lb (75.3 kg) IBW/kg (Calculated) : 66.1  Temp (24hrs), Avg:99.1 F (37.3 C), Min:99.1 F (37.3 C), Max:99.1 F (37.3 C)  Recent Labs  Lab 05/06/17 2328 05/07/17 0021 05/07/17 0207  WBC 19.5*  --   --   CREATININE 2.13*  --   --   LATICACIDVEN  --  1.68 1.86    Estimated Creatinine Clearance: 33.6 mL/min (A) (by C-G formula based on SCr of 2.13 mg/dL (H)).    Allergies  Allergen Reactions  . Doxepin Anaphylaxis  . Lisinopril Anaphylaxis  . Acetaminophen Other (See Comments)    Elevates liver enzymes,has hx of cirrhosis ( non-alcoholic )  . Contrast Media [Iodinated Diagnostic Agents] Other (See Comments)    Stopped breathing  . Gabapentin Other (See Comments)    Tremors, loopy  . Lyrica [Pregabalin] Other (See Comments)    nightmares  . Prozac [Fluoxetine Hcl] Other (See Comments)    Makes him mean   Narda Bonds 05/07/2017 4:26 AM

## 2017-05-08 ENCOUNTER — Inpatient Hospital Stay (HOSPITAL_COMMUNITY): Payer: Medicare Other

## 2017-05-08 LAB — GLUCOSE, CAPILLARY
GLUCOSE-CAPILLARY: 123 mg/dL — AB (ref 65–99)
GLUCOSE-CAPILLARY: 210 mg/dL — AB (ref 65–99)
GLUCOSE-CAPILLARY: 98 mg/dL (ref 65–99)
Glucose-Capillary: 51 mg/dL — ABNORMAL LOW (ref 65–99)
Glucose-Capillary: 53 mg/dL — ABNORMAL LOW (ref 65–99)
Glucose-Capillary: 82 mg/dL (ref 65–99)
Glucose-Capillary: 160 mg/dL — ABNORMAL HIGH (ref 65–99)
Glucose-Capillary: 93 mg/dL (ref 65–99)

## 2017-05-08 LAB — CBC
HEMATOCRIT: 30.1 % — AB (ref 39.0–52.0)
HEMOGLOBIN: 9 g/dL — AB (ref 13.0–17.0)
MCH: 25 pg — AB (ref 26.0–34.0)
MCHC: 29.9 g/dL — AB (ref 30.0–36.0)
MCV: 83.6 fL (ref 78.0–100.0)
Platelets: 207 10*3/uL (ref 150–400)
RBC: 3.6 MIL/uL — ABNORMAL LOW (ref 4.22–5.81)
RDW: 16.1 % — ABNORMAL HIGH (ref 11.5–15.5)
WBC: 13.7 10*3/uL — ABNORMAL HIGH (ref 4.0–10.5)

## 2017-05-08 LAB — PROCALCITONIN: Procalcitonin: <0.1 ng/mL

## 2017-05-08 LAB — RENAL FUNCTION PANEL
ANION GAP: 11 (ref 5–15)
Albumin: 2.6 g/dL — ABNORMAL LOW (ref 3.5–5.0)
BUN: 34 mg/dL — ABNORMAL HIGH (ref 6–20)
CO2: 31 mmol/L (ref 22–32)
Calcium: 8.7 mg/dL — ABNORMAL LOW (ref 8.9–10.3)
Chloride: 93 mmol/L — ABNORMAL LOW (ref 101–111)
Creatinine, Ser: 1.41 mg/dL — ABNORMAL HIGH (ref 0.61–1.24)
GFR calc Af Amer: >60 mL/min (ref >60)
GFR calc non Af Amer: 52 mL/min — ABNORMAL LOW (ref >60)
GLUCOSE: 56 mg/dL — AB (ref 65–99)
POTASSIUM: 3.4 mmol/L — AB (ref 3.5–5.1)
Phosphorus: 2.4 mg/dL — ABNORMAL LOW (ref 2.5–4.6)
Sodium: 135 mmol/L (ref 135–145)

## 2017-05-08 LAB — HEMOGLOBIN A1C
HEMOGLOBIN A1C: 7.5 % — AB (ref 4.8–5.6)
Mean Plasma Glucose: 168.55 mg/dL

## 2017-05-08 LAB — EXPECTORATED SPUTUM ASSESSMENT W GRAM STAIN, RFLX TO RESP C

## 2017-05-08 LAB — MAGNESIUM: Magnesium: 2 mg/dL (ref 1.7–2.4)

## 2017-05-08 LAB — EXPECTORATED SPUTUM ASSESSMENT W REFEX TO RESP CULTURE

## 2017-05-08 MED ORDER — INSULIN ASPART 100 UNIT/ML ~~LOC~~ SOLN: 0.0000 [IU] | Freq: Every day | SUBCUTANEOUS | Status: DC

## 2017-05-08 MED ORDER — HYDROMORPHONE HCL 2 MG PO TABS
2.0000 mg | ORAL_TABLET | Freq: Four times a day (QID) | ORAL | Status: DC | PRN
  Administered 2017-05-08: 2 mg via ORAL
  Filled 2017-05-08: qty 1

## 2017-05-08 MED ORDER — POTASSIUM PHOSPHATE MONOBASIC 500 MG PO TABS
500.0000 mg | ORAL_TABLET | Freq: Two times a day (BID) | ORAL | Status: DC
  Filled 2017-05-08: qty 1

## 2017-05-08 MED ORDER — K PHOS MONO-SOD PHOS DI & MONO 155-852-130 MG PO TABS
500.0000 mg | ORAL_TABLET | Freq: Two times a day (BID) | ORAL | Status: AC
  Administered 2017-05-08 (×2): 500 mg via ORAL
  Filled 2017-05-08 (×3): qty 2

## 2017-05-08 MED ORDER — ORAL CARE MOUTH RINSE
15.0000 mL | Freq: Two times a day (BID) | OROMUCOSAL | Status: DC
  Administered 2017-05-08 – 2017-05-09 (×3): 15 mL via OROMUCOSAL

## 2017-05-08 MED ORDER — FUROSEMIDE 40 MG PO TABS
40.0000 mg | ORAL_TABLET | Freq: Every day | ORAL | Status: DC
  Administered 2017-05-08 – 2017-05-09 (×2): 40 mg via ORAL
  Filled 2017-05-08 (×2): qty 1

## 2017-05-08 MED ORDER — INSULIN ASPART 100 UNIT/ML ~~LOC~~ SOLN: 0.0000 [IU] | SUBCUTANEOUS | Status: DC

## 2017-05-08 MED ORDER — INSULIN ASPART 100 UNIT/ML ~~LOC~~ SOLN
0.0000 [IU] | Freq: Three times a day (TID) | SUBCUTANEOUS | Status: DC
  Administered 2017-05-08: 1 [IU] via SUBCUTANEOUS
  Administered 2017-05-08: 2 [IU] via SUBCUTANEOUS

## 2017-05-08 MED ORDER — IBUPROFEN 800 MG PO TABS
800.0000 mg | ORAL_TABLET | Freq: Four times a day (QID) | ORAL | Status: DC | PRN
  Administered 2017-05-08 – 2017-05-09 (×3): 800 mg via ORAL
  Filled 2017-05-08 (×2): qty 4
  Filled 2017-05-08: qty 1

## 2017-05-08 MED ORDER — ATENOLOL 25 MG PO TABS
50.0000 mg | ORAL_TABLET | Freq: Every day | ORAL | Status: DC
  Administered 2017-05-08 – 2017-05-09 (×2): 50 mg via ORAL
  Filled 2017-05-08: qty 2
  Filled 2017-05-08: qty 1

## 2017-05-08 NOTE — Evaluation (Signed)
Physical Therapy Evaluation Patient Details Name: Manuel Baxter MRN: [id number] DOB: [birth year] Today's Date: 05/08/2017   History of Present Illness  62 yo active smoker with history of severe COPD on 3L O2 at home, Stage I NSCLC, OSA, CAD, liver cirrhosis, T2DM, HTN, and HLD presenting with acute encephalopathy, dyspnea and hypoxia requiring CPAP and ultimately intubation 4/3-4/4  Clinical Impression  Pt reporting that PTA he went to bathroom for BM without O2 on and was feeling dizzy for a minute before he had a syncopal event off the toilet prompting ER visit. Pt with rapid desaturation with gait with drop from 90-78% quickly in a 10' gait with pt asymptomatic. Pt educated for need to monitor saturation with all activity as pt states he tends to only check at beginning and end of gait but not mid activity which is when pt is desaturating without symptoms and continuing to mobilize due to lack of symptoms. Pt aware of energy conservation techniques and has all necessary DME at home. No further therapy needs at this time as pt functional strength and mobility at baseline. Pt highly encouraged to monitor sats at all times to prevent readmission and to maintain supplemental oxygen at all times particularly bathroom trips.     Follow Up Recommendations No PT follow up;Supervision for mobility/OOB    Equipment Recommendations       Recommendations for Other Services       Precautions / Restrictions Precautions Precautions: Fall Precaution Comments: watch O2 sats      Mobility  Bed Mobility Overal bed mobility: Modified Independent                Transfers Overall transfer level: Modified independent                  Ambulation/Gait Ambulation/Gait assistance: Supervision Ambulation Distance (Feet): 60 Feet Assistive device: Rolling walker (2 wheeled) Gait Pattern/deviations: Step-through pattern;Decreased stride length   Gait velocity interpretation: Below normal  speed for age/gender General Gait Details: cues for breathing and activity cessation due to dropping sats. Pt walked 25' on 3L with SpO2 90-93% then pt with rapid drop to 80% with pt asymptomatic prompting return to room. With seated rest recovery within 10 seconds to 90%  Stairs            Wheelchair Mobility    Modified Rankin (Stroke Patients Only)       Balance Overall balance assessment: Needs assistance   Sitting balance-Leahy Scale: Good       Standing balance-Leahy Scale: Fair                               Pertinent Vitals/Pain Pain Score: 5  Pain Location: back Pain Descriptors / Indicators: Constant Pain Intervention(s): Limited activity within patient's tolerance;Repositioned;RN gave pain meds during session    [place] expects to be discharged to:: Private residence Living Arrangements: Spouse/significant other;Children Available Help at Discharge: Available 24 hours/day Type of Home: Mobile home Home Access: Stairs to enter Entrance Stairs-Rails: Right;Left;Can reach both Entrance Stairs-Number of Steps: 4 Home Layout: One level Home Equipment: Walker - 2 wheels;Cane - single point;Bedside commode;Shower seat;Wheelchair - Press photographer      Prior Function Level of Independence: Needs assistance   Gait / Transfers Assistance Needed: pt walks max 92' with RW and uses WC for community  ADL's / Homemaking Assistance Needed: pt wears bedroom slippers to slide on, assist at times for bathing  and dressing, sits for both        Hand Dominance   Dominant Hand: Right    Extremity/Trunk Assessment   Upper Extremity Assessment Upper Extremity Assessment: Generalized weakness    Lower Extremity Assessment Lower Extremity Assessment: Generalized weakness    Cervical / Trunk Assessment Cervical / Trunk Assessment: Normal  Communication   Communication: No difficulties  Cognition Arousal/Alertness:  Awake/alert Behavior During Therapy: WFL for tasks assessed/performed Overall Cognitive Status: Within Functional Limits for tasks assessed                                        General Comments      Exercises     Assessment/Plan    PT Assessment [place] does not need any further PT services  PT Problem List Decreased strength;Decreased mobility;Decreased activity tolerance;Cardiopulmonary status limiting activity       PT Treatment Interventions      PT Goals (Current goals can be found in the Care Plan section)  Acute Rehab PT Goals PT Goal Formulation: All assessment and education complete, DC therapy    Frequency     Barriers to discharge Inaccessible home environment      Co-evaluation               AM-PAC PT "6 Clicks" Daily Activity  Outcome Measure Difficulty turning over in bed (including adjusting bedclothes, sheets and blankets)?: A Little Difficulty moving from lying on back to sitting on the side of the bed? : A Little Difficulty sitting down on and standing up from a chair with arms (e.g., wheelchair, bedside commode, etc,.)?: A Little Help needed moving to and from a bed to chair (including a wheelchair)?: A Little Help needed walking in hospital room?: A Little Help needed climbing 3-5 steps with a railing? : A Little 6 Click Score: 18    End of Session Equipment Utilized During Treatment: Gait belt;Oxygen Activity Tolerance: Treatment limited secondary to medical complications (Comment) Patient left: in chair;with call bell/phone within reach Nurse Communication: Mobility status;Precautions PT Visit Diagnosis: Difficulty in walking, not elsewhere classified (R26.2)    Time: 7425-9563 PT Time Calculation (min) (ACUTE ONLY): 24 min   Charges:   PT Evaluation $PT Eval Moderate Complexity: 1 Mod     PT G Codes:        Elwyn Reach, PT [phone]   Vivienne Sangiovanni B Aveion Nguyen 05/08/2017, 12:24 PM

## 2017-05-08 NOTE — Progress Notes (Signed)
RT in pt's room to talk to pt about CPAP.  Pt stated he does not want to wear CPAP for the night.  RT stressed the importance of him wearing CPAP through the night.  Pt again stated he did not want to wear it.

## 2017-05-08 NOTE — Progress Notes (Signed)
Inpatient Diabetes Program Recommendations  AACE/ADA: New Consensus Statement on Inpatient Glycemic Control (2015)  Target Ranges:  Prepandial:   less than 140 mg/dL      Peak postprandial:   less than 180 mg/dL (1-2 hours)      Critically ill patients:  140 - 180 mg/dL   Lab Results  Component Value Date   GLUCAP 82 05/08/2017    Review of Glycemic Control Results for Manuel Baxter, Manuel Baxter (MRN [id number]) as of 05/08/2017 09:43  Ref. Range 05/08/2017 00:12 05/08/2017 04:22 05/08/2017 04:25 05/08/2017 05:01 05/08/2017 07:47  Glucose-Capillary Latest Ref Range: 65 - 99 mg/dL 210 (H) 53 (L) 51 (L) 98 82    Diabetes history: Type 2 DM Outpatient Diabetes medications: Glipizide 5 mg BID,  Current orders for Inpatient glycemic control: Novolog 0-9 units Q4H  Inpatient Diabetes Program Recommendations:    Last A1C from 06/2015 was 6.2%, may want to consider repeating?  Noted hypoglycemic episode of 53 mg/dL and that patient now has carb modified diet. Please consider changing correction to Novolog 0-9 units TIDAC and Novolog 0-5 units QHS.  Thanks, Bronson Curb, MSN, RNC-OB Diabetes Coordinator [phone] (8a-5p)

## 2017-05-08 NOTE — Progress Notes (Signed)
   05/08/17 1400  Clinical Encounter Type  Visited With Patient and family together  Visit Type Spiritual support  Referral From Nurse  Consult/Referral To Chaplain  Recommendations prayer and encouragement  Spiritual Encounters  Spiritual Needs Prayer  Stress Factors  Patient Stress Factors Health changes  Family Stress Factors Health changes  Chaplain met with family and patient at bedside.  The family had requested prayer, the patient seems to be improving and is alert.  Chaplain talked about family support systems and religious care and affiliation with the PT.

## 2017-05-08 NOTE — Plan of Care (Signed)
Pt extubated 4/4 to 2L [place]. Maintained oxygen saturation overnight on 2L, refused use of CPAP. Patient tolerating full liquid diet, will advance to Heart Healthy/Carb Mod for AM. May need modified orders for insulin d/t CBG of 51.

## 2017-05-08 NOTE — Progress Notes (Signed)
PULMONARY / CRITICAL CARE MEDICINE   Name: Manuel Baxter MRN: [id number] DOB: 1955/10/15    ADMISSION DATE:  05/06/2017 CONSULTATION DATE:  05/07/2017  REFERRING MD:  Dr. 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     Per wife, patient was in his usual state of health until yesterday (4/3) when she noticed patient appeared confused and somnolent. He continued to worsened throughout the day and became unresponsive which prompted them to call EMS. Wife reports patient's arm were shaking yesterday. States this is not new, but appeared to worsen. She also notes one episode of urine incontinence which is not normal for him, but unclear if this occurred while patient's arm were shaking.  Patient did not voice any complaints prior to his acute change in mentation. He has a productive cough at baseline for the past 5 years.  He has been hospitalized 4 times since 01/2017 for COPD exacerbation. He is on PO Dilaudid for chronic back pain and per wife usually takes extra doses. She arranges his medications in a pill box and has noticed that he takes additional doses of several medications when he is not feeling well including Dilaudid and antihypertensives. He has a CPAP at home that he is not compliant with. He also continues to smoke ~ 0.5ppd or more.    Recently admitted 3/22-3/26 for COPD exacerbation and treated with doxycycline x5 days, prednisone x 5 days, and SABA, ICS, SAMA, and LAMA. He was recommended SNF on discharge but declined.   In the ED, he was noted to be lethargic. He received a total of 1.8mg  of Narcan with noted agitation afterwards. However, he continued to  become somnolent and lethargic and decision was made to intubate patient as he was unable to protect his airway. Significant oral secretions noted at the time of intubation. Per RN, he had diffusely decreased breath sounds on presentations without wheezes or crackles.   SUBJECTIVE:  Refused CPAP overnight No complaints, clear sputum, afebrile Pain 2/10 One episode of hypoglycemia this am Tolerating PO's  Foley removed  VITAL SIGNS: BP 121/62   Pulse 73   Temp 98.4 F (36.9 C)   Resp 14   Ht 5\' 7"  (1.702 m)   Wt 169 lb 12.1 oz (77 kg)   SpO2 99%   BMI 26.59 kg/m   HEMODYNAMICS:    VENTILATOR SETTINGS: Vent Mode: PSV;CPAP FiO2 (%):  [40 %] 40 % Set Rate:  [15 bmp-18 bmp] 18 bmp Vt Set:  [490 mL-530 mL] 530 mL PEEP:  [5 cmH20] 5 cmH20 Pressure Support:  [5 cmH20] 5 cmH20 Plateau Pressure:  [15 cmH20-20 cmH20] 20 cmH20  INTAKE / OUTPUT: I/O last 3 completed shifts: In: 5684.3 [P.O.:1080; I.V.:3754.3; IV Piggyback:850] Out: 2125 [Urine:1925; Emesis/NG output:200]  PHYSICAL EXAMINATION: General:  Older adult male sitting in bed in NAD HEENT: MM pink/moist, PERRL  Neuro: Alert, oriented, MAE, nonfocal CV: s1s2 rrr, no m/r/g PULM: even/non-labored, lungs bilaterally clear, minimally diminished in bases, no wheezes GI: obese, soft, non-tender, bs active  Extremities: warm/dry, no BLE edema  Skin: no rashes   LABS:  BMET Recent Labs  Lab 05/06/17 2328 05/07/17 0439 05/08/17 8676  NA 130* 130* 135  K 3.2* 3.2* 3.4*  CL 81* 87* 93*  CO2 35* 30 31  BUN 59* 59* 34*  CREATININE 2.13* 1.97* 1.41*  GLUCOSE 169* 239* 56*    Electrolytes Recent Labs  Lab 05/06/17 2328 05/07/17 0439 05/08/17 0442  CALCIUM 10.2 9.1 8.7*  MG  --  1.9 2.0  PHOS  --  3.3 2.4*    CBC Recent Labs  Lab 05/06/17 2328 05/07/17 0439 05/08/17 0442  WBC 19.5* 11.3* 13.7*  HGB 11.3* 9.4* 9.0*  HCT 36.8* 30.7* 30.1*  PLT 253 190 207    Coag's No results for input(s): APTT, INR  in the last 168 hours.  Sepsis Markers Recent Labs  Lab 05/07/17 0207 05/07/17 0439 05/07/17 0735 05/08/17 0442  LATICACIDVEN 1.86 1.9 2.8*  --   PROCALCITON  --  0.17  --  <0.10    ABG Recent Labs  Lab 05/07/17 0001 05/07/17 0309  PHART 7.412 7.421  PCO2ART 58.2* 60.7*  PO2ART 65.0* 460.0*    Liver Enzymes Recent Labs  Lab 05/06/17 2328 05/08/17 0442  AST 36  --   ALT 30  --   ALKPHOS 130*  --   BILITOT 0.7  --   ALBUMIN 3.5 2.6*    Cardiac Enzymes No results for input(s): TROPONINI, PROBNP in the last 168 hours.  Glucose Recent Labs  Lab 05/07/17 1937 05/08/17 0012 05/08/17 0422 05/08/17 0425 05/08/17 0501 05/08/17 0747  GLUCAP 151* 210* 53* 51* 98 82    Imaging Ct Head Wo Contrast  Result Date: 05/07/2017 CLINICAL DATA:  Altered level of consciousness. History of lung cancer. EXAM: CT HEAD WITHOUT CONTRAST TECHNIQUE: Contiguous axial images were obtained from the base of the skull through the vertex without intravenous contrast. COMPARISON:  None. FINDINGS: Brain: Sequela of prior right posterior parietal/occipital subcortical infarct with associated encephalomalacia (image 22, series 3, sagittal image 21, series 6). Scattered periventricular hypodensities compatible with microvascular ischemic disease. Mild atrophy with sulcal prominence centralized volume loss with commensurate ex vacuo dilatation of the ventricular system. No CT evidence of acute large territory infarct. No intraparenchymal or extra-axial mass or hemorrhage. Normal configuration of the ventricles and the basilar cisterns. No midline shift. Vascular: Intracranial atherosclerosis. Skull: No displaced calvarial fracture. There is underpneumatization of the bilateral Sinuses/Orbits: There is underpneumatization of the bilateral frontal sinuses. Mucosal thickening involving the bilateral anterior and posterior ethmoidal air cells, the bilateral sphenoid sinuses and the right maxillary sinus.  Scattered opacification of the left mastoid air cells. No air-fluid levels. Other: Regional soft tissues appear normal. IMPRESSION: 1. 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 2. Sinus disease as above.  No air-fluid levels. Electronically Signed   By: Sandi Mariscal M.D.   On: 05/07/2017 12:03   US Renal  Result Date: 05/07/2017 CLINICAL DATA:  Acute renal injury EXAM: RENAL / URINARY TRACT ULTRASOUND COMPLETE COMPARISON:  None. FINDINGS: Right Kidney: Length: 9.2 cm. Echogenicity within normal limits. There is renal cortical thinning. No mass, perinephric fluid, or hydronephrosis visualized. No sonographically demonstrable calculus or ureterectasis. Left Kidney: Length: 9.7 cm. Echogenicity within normal limits. There is renal cortical thinning. No mass, perinephric fluid, or hydronephrosis visualized. No sonographically demonstrable calculus or ureterectasis. Bladder: Appears normal for degree of bladder distention. A Foley catheter is positioned within the urinary bladder. IMPRESSION: Renal cortical thinning bilaterally, a finding that may be seen with medical renal disease. The renal echogenicity is normal. No obstructing focus  in either kidney. Electronically Signed   By: Lowella Grip III M.D.   On: 05/07/2017 09:27   Dg Chest Port 1 View  Result Date: 05/08/2017 CLINICAL DATA:  Respiratory failure EXAM: PORTABLE CHEST 1 VIEW COMPARISON:  May 07, 2017 chest radiograph; chest CT January 14, 2017 FINDINGS: Endotracheal tube and nasogastric tube have been removed. No pneumothorax. There is evidence of bullous disease in the upper lobes, more on the right than on the left. There is interstitial prominence in the lung bases in part due to redistribution of blood flow to viable segments and in part due to fibrosis. There is scarring in the right lower lobe as well as in the right hilar region. There is no frank edema  or consolidation. Heart size and pulmonary vascularity are normal. No adenopathy evident. There is aortic atherosclerosis. No evident bone lesions. IMPRESSION: Evidence of emphysematous change, better delineated on prior CT. No edema or consolidation. Areas of scarring on the right. Interstitial prominence in the bases, in part due to redistribution of blood flow to viable lung segments. There also appears to be a degree of fibrosis in the lung bases. No consolidation. Stable cardiac silhouette. There is aortic atherosclerosis. Aortic Atherosclerosis (ICD10-I70.0) and Emphysema (ICD10-J43.9). Electronically Signed   By: Lowella Grip III M.D.   On: 05/08/2017 07:47   STUDIES:  CXR pre-intubation: hyperinflation with emphysematous changes, spiculated opacity at R hilum, no focal opacity  CXR post-intubation: no changes other than appropriately placed ET tube  4/4 CTH >> 1. 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 2. Sinus disease; No air-fluid levels. 4/4 Renal US >> Renal cortical thinning bilaterally, a finding that may be seen with medical renal disease. The renal echogenicity is normal. No obstructing focus in either kidney.  CULTURES: 4/4 Bcx sent >>  4/4 sputum cx >> 4/4 trach asp >> 4/4 RVP >> + coronavirus 229e (previously +coronavirus OC43 02/2017) 4/4 MRSA PCR >> neg  ANTIBIOTICS: Levofloxacin 4/4 >> 4/4 Vancomycin 4/4 >>  4/4 Zosyn 4/4 >> 4/4  SIGNIFICANT EVENTS: 4/3-4  Admit 4/4 PM extubated 4/5  tx to tele  LINES/TUBES: PIV LUE + RUE  4/4 ETT >> 4/4 4/4 Foley catheter >> 4/5  ASSESSMENT / PLAN:  PULMONARY A: Acute on chronic hypoxic hypercapnic respiratory failure likely multifactorial from end-stage COPD +/- opioid misuse Severe COPD (FEV1 23%) on chronic O2 at home, 3L  Stage I NSCLC s/p radiation 2016 OSA - noncompliant w/CPAP R/o Aspiration event/ PNA - r/o, no obvious  infiltrate, afebrile, neg PCT P:   Aggressive pulmonary hygiene, IS, mobilize  O2 prn sats > 88%, baseline 3L O2 Continue to recommend CPAP HS even though he refuses Ongoing smoking cessation, patient is interested in more information Continue duonebs q 6, albuterol prn Continue Singulair 10mg  daily, flonase, spiriva PT/OT consult  CARDIOVASCULAR A:  Hypotension- likely related to sedation Hx of HFpEF (TTE 02/2017 with EF 55%, mild LVH, and G1DD. No wall motion abnormalities), HTN  P:  tx to floor  Tele monitoring Goal MAP > 65 Restart daily lasix, atenolol 50 g daily for now (home dose BID) continue ASA, plavix   RENAL A:   AKI with Scr 2.1- improving   HTN  - Renal US neg P:   D/c IVF Foley removed Restart lasix  Trend BMP /mag/ phos/ urinary output Replace electrolytes as indicated Avoid nephrotoxic agents, ensure adequate renal perfusion   GASTROINTESTINAL A:   Hx GERD, Liver  cirrhosis, chronic Constipation (Opioid induced) - ammonia 28 P:   Advance diet as tolerated, carb modified Colace per tube BID  dulcolax prn   HEMATOLOGIC  Anemia - stable P:  SQ heparin TID for VTE ppx   INFECTIOUS A:   Leukocytosis, likely reactive P:   reassuring neg pct Monitor clinically  ENDOCRINE A:   T2DM  P:   SSI resistant to sensitive  CBG monitoring q 4   NEUROLOGIC A:   Acute encephalopathy-likely related to Misuse of home meds - resolved Chronic back pain on chronic opiate therapy  - at baseline mental status  P:   Continue home dilaudid 4mg  q 4-6 prn  PT/OT eval  UPDATES:  Patient and wife updated on plan of care at bedside.  Patient is stable from a respiratory and hemodynamically standpoint and will be transferred to to tele unit.  Patient obtains his medical care from the [place] and will followup there once discharged.   Will transfer care to [facility] as of 4/6 w/PCCM signoff.  Please call if needed during this hospitalization.   Kennieth Rad,  AGACNP-BC [facility] Pgr: [phone] or if no answer [phone] 05/08/2017, 8:18 AM

## 2017-05-08 NOTE — Progress Notes (Signed)
CRITICAL VALUE ALERT  Critical Value:  CBG 51  Date & Time Notied:  4/5 0425  Actions taken: 15 g carbs given to patient orally. Will continue to assess patient and recheck CBG  CBG rechecked at 0500 was 98. Insulin scale may be too aggressive for patient at this time. Will discuss with MD before next dose.

## 2017-05-08 NOTE — Plan of Care (Signed)
Foley catheter removed and condom catheter placed.  Will continue to monitor.  Katherine Mantle RN

## 2017-05-09 LAB — CULTURE, RESPIRATORY W GRAM STAIN: Culture: NORMAL

## 2017-05-09 LAB — GLUCOSE, CAPILLARY
Glucose-Capillary: 114 mg/dL — ABNORMAL HIGH (ref 65–99)
Glucose-Capillary: 130 mg/dL — ABNORMAL HIGH (ref 65–99)

## 2017-05-09 LAB — CULTURE, RESPIRATORY

## 2017-05-09 MED ORDER — PREDNISONE 10 MG PO TABS: ORAL_TABLET | ORAL | 0 refills | Status: AC

## 2017-05-09 MED ORDER — ALBUTEROL SULFATE (2.5 MG/3ML) 0.083% IN NEBU
2.5000 mg | INHALATION_SOLUTION | Freq: Four times a day (QID) | RESPIRATORY_TRACT | Status: DC
  Administered 2017-05-09: 2.5 mg via RESPIRATORY_TRACT
  Filled 2017-05-09: qty 3

## 2017-05-09 MED ORDER — ALBUTEROL SULFATE (2.5 MG/3ML) 0.083% IN NEBU: 2.5000 mg | INHALATION_SOLUTION | Freq: Three times a day (TID) | RESPIRATORY_TRACT | Status: DC

## 2017-05-09 MED ORDER — PREDNISONE 20 MG PO TABS
40.0000 mg | ORAL_TABLET | Freq: Every day | ORAL | Status: DC
  Administered 2017-05-09: 40 mg via ORAL
  Filled 2017-05-09: qty 2

## 2017-05-09 NOTE — Progress Notes (Signed)
AVS given to patient. All questions answered and belongings returned. Patient d/ced with no IV and on 2 L [place]. Wife at bedside for DC.  Pauline Good RN

## 2017-05-09 NOTE — Discharge Instructions (Signed)
Chronic Obstructive Pulmonary Disease Exacerbation  Chronic obstructive pulmonary disease (COPD) is a common lung problem. In COPD, the flow of air from the lungs is limited. COPD exacerbations are times that breathing gets worse and you need extra treatment. Without treatment they can be life threatening. If they happen often, your lungs can become more damaged. If your COPD gets worse, your doctor may treat you with:  ? Medicines.  ? Oxygen.  ? Different ways to clear your airway, such as using a mask.    Follow these instructions at home:  ? Do not smoke.  ? Avoid tobacco smoke and other things that bother your lungs.  ? If given, take your antibiotic medicine as told. Finish the medicine even if you start to feel better.  ? Only take medicines as told by your doctor.  ? Drink enough fluids to keep your pee (urine) clear or pale yellow (unless your doctor has told you not to).  ? Use a cool mist machine (vaporizer).  ? If you use oxygen or a machine that turns liquid medicine into a mist (nebulizer), continue to use them as told.  ? Keep up with shots (vaccinations) as told by your doctor.  ? Exercise regularly.  ? Eat healthy foods.  ? Keep all doctor visits as told.  Get help right away if:  ? You are very short of breath and it gets worse.  ? You have trouble talking.  ? You have bad chest pain.  ? You have blood in your spit (sputum).  ? You have a fever.  ? You keep throwing up (vomiting).  ? You feel weak, or you pass out (faint).  ? You feel confused.  ? You keep getting worse.  This information is not intended to replace advice given to you by your health care provider. Make sure you discuss any questions you have with your health care provider.  Document Released: 01/09/2011 Document Revised: 06/28/2015 Document Reviewed: 09/24/2012  [employer] Interactive Patient Education ? 2017 [employer]

## 2017-05-09 NOTE — Discharge Summary (Signed)
Physician Discharge Summary  Manuel Baxter YQM:[id number] DOB: [birth year] DOA: 05/06/2017  PCP: [facility]  Admit date: 05/06/2017 Discharge date: 05/09/2017  Admitted From: Home Disposition: Home  Recommendations for Outpatient Follow-up:  1. Follow up with PCP in 1 week 2. Please obtain BMP/CBC in one week 3. 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     Per wife, patient was in his usual state of health until yesterday (4/3) when she noticed patient appeared confused and somnolent. He continued to worsened throughout the day and became unresponsive which prompted them to call EMS. Wife reports patient's arm were shaking yesterday. States this is not new, but appeared to worsen. She also notes one episode of urine incontinence which is not normal for him, but unclear if this occurred while patient's arm were shaking.  Patient did not voice any complaints prior to his acute change in mentation. He has a productive cough at baseline for the past 5 years.  He has been hospitalized 4 times since 01/2017 for COPD exacerbation. He is on PO Dilaudid for chronic back pain and per wife usually takes extra doses. She arranges his medications in a pill box and has noticed that he takes additional doses of several medications when he is not feeling well including Dilaudid and antihypertensives. He has  a CPAP at home that he is not compliant with. He also continues to smoke ~ 0.5ppd or more.    Recently admitted 3/22-3/26 for COPD exacerbation and treated with doxycycline x5 days, prednisone x 5 days, and SABA, ICS, SAMA, and LAMA. He was recommended SNF on discharge but declined.   In the ED, he was noted to be lethargic. He received a total of 1.8mg  of Narcan with noted agitation afterwards. However, he continued to become somnolent and lethargic and decision was made to intubate patient as he was unable to protect his airway. Significant oral secretions noted at the time of intubation. Per RN, he had diffusely decreased breath sounds on presentations without wheezes or crackles.    Hospital course:  Acute on chronic respiratory failure with hypoxia and hypercarbia Unclear cause. Possible COPD exacerbation vs non-compliance with CPAP vs opiate misuse. Patient required intubation on 4/4 and was extubated the same day. PT evaluated and had no recommendations for follow-up. Patient ambulated and maintained SpO2 of 93% on home 3L. Continue home medications with the addition of prednisone taper. Outpatient follow-up with PCP.  Acute toxic encephalopathy Thought secondary to misuse of chronic opiates. Resolved.  Hypotension Secondary to sedation. Resolved.  Essential hypertension Continued home antihypertensives  Acute kidney injury Baseline is normal (<1). 2.13 on admission, trending down prior to discharge. Outpatient follow-up.  GERD Continue PPI  Cirrhosis without ascites  Anemia Unknown etiology. No evidence of bleeding. Stable. Recheck as an outpatient.  Diabetes mellitus, type 2 Sliding scale while inpatient. Continue glipizide on discharge.  Leukocytosis Coronavirus positive. Recheck as an outpatient.  Discharge Diagnoses:  Active Problems:   Acute respiratory failure with hypoxia (HCC)   Pressure injury of skin    Discharge Instructions   Allergies as of  05/09/2017      Reactions   Doxepin Anaphylaxis   Lisinopril Anaphylaxis   Acetaminophen Other (See Comments)   Elevates liver enzymes,has hx of cirrhosis ( non-alcoholic )   Contrast Media [iodinated Diagnostic Agents] Other (See Comments)   Stopped breathing   Gabapentin Other (See Comments)   Tremors, loopy   Lyrica [pregabalin] Other (See Comments)   nightmares   Prozac [fluoxetine Hcl] Other (See Comments)   Makes him mean      Medication List    STOP taking these medications   phenylephrine 10 MG Tabs tablet Commonly known as:  SUDAFED PE     TAKE these medications   aspirin EC 81 MG tablet Take 81 mg by mouth daily.   atenolol 50 MG tablet Commonly known as:  TENORMIN Take 50 mg by mouth 2 (two) times daily.   BC FAST PAIN RELIEF ARTHRITIS 1000-65 MG Pack Generic drug:  Aspirin-Caffeine Take 2 packets by mouth every 4 (four) hours as needed (pain/headache).   clopidogrel 75 MG tablet Commonly known as:  PLAVIX Take 75 mg by mouth daily with supper.   cyanocobalamin 500 MCG tablet Take 500 mcg by mouth 2 (two) times daily. Vitamin B12   DULoxetine 30 MG capsule Commonly known as:  CYMBALTA Take 30 mg by mouth daily.   Fish Oil 1000 MG Caps Take 1,000-2,000 mg by mouth See admin instructions. Take 2 capsules (2000 mg) by mouth every morning and 1 capsule (1000 mg) before supper   fluticasone 50 MCG/ACT nasal spray Commonly known as:  FLONASE Place 1 spray into both nostrils daily.   furosemide 40 MG tablet Commonly known as:  LASIX Take 1 tablet (40 mg total) by mouth daily.   GAVISCON PO Take 1 tablet by mouth daily as needed (indigestion/heartburn).   glipiZIDE 5 MG tablet Commonly known as:  GLUCOTROL Take 2.5 mg by mouth 2 (two) times daily before a meal.   HYDROmorphone 4 MG tablet Commonly known as:  DILAUDID Take 4 mg by mouth every 6 (six) hours as needed (chronic pain).   ipratropium-albuterol 0.5-2.5 (3) MG/3ML Soln Commonly known as:   DUONEB Take 3 mLs by nebulization every 6 (six) hours.   loratadine 10 MG tablet Commonly known as:  CLARITIN Take 10 mg by mouth daily.   montelukast 10 MG tablet Commonly known as:  SINGULAIR Take 10 mg by mouth daily with supper.   omeprazole 20 MG capsule Commonly known as:  PRILOSEC Take 20 mg by mouth daily.   OVER THE COUNTER MEDICATION Place 0.5-1 mLs under the tongue See admin instructions. [employer] - place 1/2 dropperful (0.5 ml) under the tongue twice during the day, place 1 dropperful (1 ml) under the tongue at bedtime. 1 ml - 250 mg hemp, 405 mg linoleic acid, 135 mg Alpha Linoleic Acid, 90 m Oleic Acid.   predniSONE 10 MG tablet Commonly known as:  DELTASONE Take 4 tablets (40 mg total) by mouth daily with breakfast for 2 days, THEN 3 tablets (30 mg total) daily with breakfast for 3 days, THEN 2 tablets (20 mg total) daily with breakfast for 3 days, THEN 1 tablet (10 mg total) daily with breakfast for 4 days. Start taking on:  05/10/2017   PROAIR HFA 108 (90 Base) MCG/ACT inhaler Generic drug:  albuterol Inhale  2 puffs into the lungs every 6 (six) hours as needed for wheezing or shortness of breath.   tiotropium 18 MCG inhalation capsule Commonly known as:  SPIRIVA Place 18 mcg into inhaler and inhale daily.   Vitamin D 2000 units tablet Take 2,000 Units by mouth 2 (two) times daily.      Follow-up [facility]. Schedule an appointment as soon as possible for a visit in 1 week(s).   Specialty:  General Practice Contact information: [place] [id number] [phone]          Allergies  Allergen Reactions  . Doxepin Anaphylaxis  . Lisinopril Anaphylaxis  . Acetaminophen Other (See Comments)    Elevates liver enzymes,has hx of cirrhosis ( non-alcoholic )  . Contrast Media [Iodinated Diagnostic Agents] Other (See Comments)    Stopped breathing  . Gabapentin Other (See Comments)    Tremors,  loopy  . Lyrica [Pregabalin] Other (See Comments)    nightmares  . Prozac [Fluoxetine Hcl] Other (See Comments)    Makes him mean    Consultations:  PCCM   Procedures/Studies: Dg Chest 2 View  Result Date: 04/26/2017 CLINICAL DATA:  Shortness of breath and chest pain 2 days. EXAM: CHEST - 2 VIEW COMPARISON:  04/24/2017 and 01/13/2017 as well as CT 01/14/2017 FINDINGS: Lordotic technique is demonstrated. Lungs are adequately inflated with slightly less dense irregular right hilar opacification. No additional focal airspace process or effusion. Mild stable cardiomegaly. Remainder of the exam is unchanged. IMPRESSION: No acute findings. Slight improvement in right hilar irregular opacification previously evaluated by CT. Recommend follow-up contrast enhanced chest CT for more accurate evaluation of interval change. Electronically Signed   By: Marin Olp M.D.   On: 04/26/2017 16:34   Ct Head Wo Contrast  Result Date: 05/07/2017 CLINICAL DATA:  Altered level of consciousness. History of lung cancer. EXAM: CT HEAD WITHOUT CONTRAST TECHNIQUE: Contiguous axial images were obtained from the base of the skull through the vertex without intravenous contrast. COMPARISON:  None. FINDINGS: Brain: Sequela of prior right posterior parietal/occipital subcortical infarct with associated encephalomalacia (image 22, series 3, sagittal image 21, series 6). Scattered periventricular hypodensities compatible with microvascular ischemic disease. Mild atrophy with sulcal prominence centralized volume loss with commensurate ex vacuo dilatation of the ventricular system. No CT evidence of acute large territory infarct. No intraparenchymal or extra-axial mass or hemorrhage. Normal configuration of the ventricles and the basilar cisterns. No midline shift. Vascular: Intracranial atherosclerosis. Skull: No displaced calvarial fracture. There is underpneumatization of the bilateral Sinuses/Orbits: There is underpneumatization  of the bilateral frontal sinuses. Mucosal thickening involving the bilateral anterior and posterior ethmoidal air cells, the bilateral sphenoid sinuses and the right maxillary sinus. Scattered opacification of the left mastoid air cells. No air-fluid levels. Other: Regional soft tissues appear normal. IMPRESSION: 1. 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 2. Sinus disease as above.  No air-fluid levels. Electronically Signed   By: Sandi Mariscal M.D.   On: 05/07/2017 12:03   US Renal  Result Date: 05/07/2017 CLINICAL DATA:  Acute renal injury EXAM: RENAL / URINARY TRACT ULTRASOUND COMPLETE COMPARISON:  None. FINDINGS: Right Kidney: Length: 9.2 cm. Echogenicity within normal limits. There is renal cortical thinning. No mass, perinephric fluid, or hydronephrosis visualized. No sonographically demonstrable calculus or ureterectasis. Left Kidney: Length: 9.7 cm. Echogenicity within normal limits. There is renal cortical thinning. No mass, perinephric fluid, or hydronephrosis visualized. No sonographically  demonstrable calculus or ureterectasis. Bladder: Appears normal for degree of bladder distention. A Foley catheter is positioned within the urinary bladder. IMPRESSION: Renal cortical thinning bilaterally, a finding that may be seen with medical renal disease. The renal echogenicity is normal. No obstructing focus in either kidney. Electronically Signed   By: Lowella Grip III M.D.   On: 05/07/2017 09:27   Nm Pulmonary Perf And Vent  Result Date: 04/26/2017 CLINICAL DATA:  Evaluate for possible pulmonary embolism. History of obstructive sleep apnea and COPD. Smoker. Productive cough and worsening shortness of breath. History of lung cancer. EXAM: NUCLEAR MEDICINE VENTILATION - PERFUSION LUNG SCAN TECHNIQUE: Ventilation images were obtained in multiple projections using inhaled aerosol Tc-62m DTPA. Perfusion images were  obtained in multiple projections after intravenous injection of Tc-80m-MAA. RADIOPHARMACEUTICALS:  32.8 mCi of Tc-30m DTPA aerosol inhalation and 4.33 mCi Tc2m-MAA IV COMPARISON:  Chest x-ray today as well as CT 01/14/2017. FINDINGS: Chest x-ray demonstrates irregular opacification over the right hilar region. Ventilation: Moderate bilateral central airway clumping of radiotracer. Decrease uptake over the right midlung. Perfusion: Mild shine through of central airway clumping of radiotracer. No wedge shaped peripheral mismatched perfusion defects to suggest acute pulmonary embolism. Matched area of decreased perfusion over the right midlung. IMPRESSION: Findings compatible with low probability for pulmonary embolism. Electronically Signed   By: Marin Olp M.D.   On: 04/26/2017 17:54   Dg Chest Port 1 View  Result Date: 05/08/2017 CLINICAL DATA:  Respiratory failure EXAM: PORTABLE CHEST 1 VIEW COMPARISON:  May 07, 2017 chest radiograph; chest CT January 14, 2017 FINDINGS: Endotracheal tube and nasogastric tube have been removed. No pneumothorax. There is evidence of bullous disease in the upper lobes, more on the right than on the left. There is interstitial prominence in the lung bases in part due to redistribution of blood flow to viable segments and in part due to fibrosis. There is scarring in the right lower lobe as well as in the right hilar region. There is no frank edema or consolidation. Heart size and pulmonary vascularity are normal. No adenopathy evident. There is aortic atherosclerosis. No evident bone lesions. IMPRESSION: Evidence of emphysematous change, better delineated on prior CT. No edema or consolidation. Areas of scarring on the right. Interstitial prominence in the bases, in part due to redistribution of blood flow to viable lung segments. There also appears to be a degree of fibrosis in the lung bases. No consolidation. Stable cardiac silhouette. There is aortic atherosclerosis.  Aortic Atherosclerosis (ICD10-I70.0) and Emphysema (ICD10-J43.9). Electronically Signed   By: Lowella Grip III M.D.   On: 05/08/2017 07:47   Dg Chest Portable 1 View  Result Date: 05/07/2017 CLINICAL DATA:  Post intubation EXAM: PORTABLE CHEST 1 VIEW COMPARISON:  05/06/2017, 04/26/2017 FINDINGS: Endotracheal tube tip is approximately 4.9 cm superior to the carina. Esophageal tube tip extends below the diaphragm but is not included. Emphysematous disease with linear scar or atelectasis in the right lower lung. Architectural distortion in the right hilus. Stable cardiomediastinal silhouette. No pneumothorax. IMPRESSION: 1. Endotracheal tube tip about 4.9 cm superior to carina 2. Otherwise no significant interval change in emphysematous disease and spiculated opacity in the right hilar region Electronically Signed   By: Donavan Foil M.D.   On: 05/07/2017 02:37   Dg Chest Port 1 View  Result Date: 05/07/2017 CLINICAL DATA:  Shortness of breath with fever EXAM: PORTABLE CHEST 1 VIEW COMPARISON:  04/26/2017, 04/24/2017, 02/06/2017, 01/15/2017, CT chest 01/14/2017 FINDINGS: Hyperinflation with emphysematous disease. Architectural distortion in  the right hilar region. No acute consolidation or pleural effusion. Stable heart size. Aortic atherosclerosis. No pneumothorax. IMPRESSION: 1. Hyperinflation with emphysematous disease 2. Architectural distortion/spiculated opacity in the right hilar region, corresponding to the potential mass on prior chest CT. 3. No acute focal opacity. Electronically Signed   By: Donavan Foil M.D.   On: 05/07/2017 00:09   Dg Chest Port 1 View  Result Date: 04/24/2017 CLINICAL DATA:  Dyspnea EXAM: PORTABLE CHEST 1 VIEW COMPARISON:  Chest CT 01/14/2017, CXR 02/08/2017 FINDINGS: Stable heart size and mediastinal contours. Spiculated masslike opacity in the right perihilar region is redemonstrated with interval development of platelike atelectasis along its periphery. Platelike  atelectasis is slightly more prominent along the subpleural aspect of the left lower lung as well. Emphysematous hyperinflation of the lungs is otherwise noted without acute pneumonic consolidation, effusion or pulmonary edema. No pneumothorax. No aggressive osseous abnormality. IMPRESSION: Emphysematous lungs with redemonstration of spiculated right perihilar masslike opacity. Bilateral midlung platelike atelectasis is demonstrated, new since prior. No pneumonia or CHF. Electronically Signed   By: Ashley Royalty M.D.   On: 04/24/2017 19:40   Dg Abd Portable 1v  Result Date: 05/07/2017 CLINICAL DATA:  Orogastric tube placement EXAM: PORTABLE ABDOMEN - 1 VIEW COMPARISON:  None. FINDINGS: Orogastric tube side port is just beyond the gastroesophageal junction. Recommended Vance min by another 5 cm. Lung bases are clear. IMPRESSION: Orogastric tube side port just beyond the gastroesophageal junction. This could be further advanced by 5 cm. Electronically Signed   By: Ulyses Jarred M.D.   On: 05/07/2017 06:20      Subjective: Feels better.  Discharge Exam: Vitals:   05/09/17 0826 05/09/17 1154  BP:    Pulse:    Resp:    Temp:    SpO2: 98% 98%   Vitals:   05/09/17 0352 05/09/17 0806 05/09/17 0826 05/09/17 1154  BP:  108/72    Pulse:  74    Resp:  19    Temp:  98.2 F (36.8 C)    TempSrc:  Oral    SpO2:  100% 98% 98%  Weight: 75.3 kg (166 lb 1.6 oz)     Height:        General: Pt is alert, awake, not in acute distress Cardiovascular: RRR, S1/S2 +, no rubs, no gallops Respiratory: Decreased breath sounds with wheezing. Abdominal: Soft, NT, ND, bowel sounds + Extremities: no edema, no cyanosis    The results of significant diagnostics from this hospitalization (including imaging, microbiology, ancillary and laboratory) are listed below for reference.     Microbiology: Recent Results (from the past 240 hour(s))  Blood Culture (routine x 2)     Status: None (Preliminary result)    Collection Time: 05/07/17  2:00 AM  Result Value Ref Range Status   Specimen Description BLOOD RIGHT HAND  Final   Special Requests IN PEDIATRIC BOTTLE Blood Culture adequate volume  Final   Culture   Final    NO GROWTH 2 DAYS Performed at [facility], [address], [place], [place] [address]    Report Status PENDING  Incomplete  Blood Culture (routine x 2)     Status: None (Preliminary result)   Collection Time: 05/07/17  2:02 AM  Result Value Ref Range Status   Specimen Description BLOOD LEFT FOREARM  Final   Special Requests   Final    BOTTLES DRAWN AEROBIC AND ANAEROBIC Blood Culture adequate volume   Culture   Final    NO  GROWTH 2 DAYS Performed at [facility], [place] [address] [place], [place] [address]    Report Status PENDING  Incomplete  Culture, expectorated sputum-assessment     Status: None   Collection Time: 05/07/17  4:10 AM  Result Value Ref Range Status   Specimen Description EXPECTORATED SPUTUM  Final   Special Requests NONE  Final   Sputum evaluation   Final    THIS SPECIMEN IS ACCEPTABLE FOR SPUTUM CULTURE Performed at [facility], [place] [address], [place], [place] [address]    Report Status 05/08/2017 FINAL  Final  Culture, respiratory (NON-Expectorated)     Status: None   Collection Time: 05/07/17  4:10 AM  Result Value Ref Range Status   Specimen Description EXPECTORATED SPUTUM  Final   Special Requests NONE Reflexed from [id number]  Final   Gram Stain   Final    MODERATE WBC PRESENT, PREDOMINANTLY PMN FEW GRAM POSITIVE COCCI RARE GRAM POSITIVE RODS    Culture   Final    Consistent with normal respiratory flora. Performed at [facility], [place] [address] [place], [place] [address]    Report Status 05/09/2017 FINAL  Final  MRSA PCR Screening     Status: None   Collection Time: 05/07/17  6:24 AM  Result Value Ref Range Status   MRSA by PCR NEGATIVE NEGATIVE Final    Comment:        The GeneXpert MRSA Assay (FDA approved for  NASAL specimens only), is one component of a comprehensive MRSA colonization surveillance program. It is not intended to diagnose MRSA infection nor to guide or monitor treatment for MRSA infections. Performed at [facility], [place] [address] [place], [place] [address]   Respiratory Panel by PCR     Status: Abnormal   Collection Time: 05/07/17  7:24 AM  Result Value Ref Range Status   Adenovirus NOT DETECTED NOT DETECTED Final   Coronavirus 229E DETECTED (A) NOT DETECTED Final   Coronavirus HKU1 NOT DETECTED NOT DETECTED Final   Coronavirus NL63 NOT DETECTED NOT DETECTED Final   Coronavirus OC43 NOT DETECTED NOT DETECTED Final   Metapneumovirus NOT DETECTED NOT DETECTED Final   Rhinovirus / Enterovirus NOT DETECTED NOT DETECTED Final   Influenza A NOT DETECTED NOT DETECTED Final   Influenza B NOT DETECTED NOT DETECTED Final   Parainfluenza Virus 1 NOT DETECTED NOT DETECTED Final   Parainfluenza Virus 2 NOT DETECTED NOT DETECTED Final   Parainfluenza Virus 3 NOT DETECTED NOT DETECTED Final   Parainfluenza Virus 4 NOT DETECTED NOT DETECTED Final   Respiratory Syncytial Virus NOT DETECTED NOT DETECTED Final   Bordetella pertussis NOT DETECTED NOT DETECTED Final   Chlamydophila pneumoniae NOT DETECTED NOT DETECTED Final   Mycoplasma pneumoniae NOT DETECTED NOT DETECTED Final    Comment: Performed at [facility], [place] [address], [place], [place] [address]     Labs: BNP (last 3 results) Recent Labs    04/24/17 1915 04/26/17 0829  BNP 155.4* 485.4*   Basic Metabolic Panel: Recent Labs  Lab 05/06/17 2328 05/07/17 0439 05/08/17 0442  NA 130* 130* 135  K 3.2* 3.2* 3.4*  CL 81* 87* 93*  CO2 35* 30 31  GLUCOSE 169* 239* 56*  BUN 59* 59* 34*  CREATININE 2.13* 1.97* 1.41*  CALCIUM 10.2 9.1 8.7*  MG  --  1.9 2.0  PHOS  --  3.3 2.4*   Liver Function Tests: Recent Labs  Lab 05/06/17 2328  05/08/17 0442  AST 36  --   ALT 30  --   ALKPHOS 130*  --   BILITOT  0.7  --   PROT 7.6  --   ALBUMIN 3.5 2.6*   No results for input(s): LIPASE, AMYLASE in the last 168 hours. Recent Labs  Lab 05/07/17 0159  AMMONIA 28   CBC: Recent Labs  Lab 05/06/17 2328 05/07/17 0439 05/08/17 0442  WBC 19.5* 11.3* 13.7*  NEUTROABS 15.0*  --   --   HGB 11.3* 9.4* 9.0*  HCT 36.8* 30.7* 30.1*  MCV 81.8 81.2 83.6  PLT 253 190 207   Cardiac Enzymes: No results for input(s): CKTOTAL, CKMB, CKMBINDEX, TROPONINI in the last 168 hours. BNP: Invalid input(s): POCBNP CBG: Recent Labs  Lab 05/08/17 1131 05/08/17 1725 05/08/17 2116 05/09/17 0806 05/09/17 1209  GLUCAP 160* 123* 93 114* 130*   D-Dimer No results for input(s): DDIMER in the last 72 hours. Hgb A1c Recent Labs    05/08/17 1011  HGBA1C 7.5*   Lipid Profile No results for input(s): CHOL, HDL, LDLCALC, TRIG, CHOLHDL, LDLDIRECT in the last 72 hours. Thyroid function studies No results for input(s): TSH, T4TOTAL, T3FREE, THYROIDAB in the last 72 hours.  Invalid input(s): FREET3 Anemia work up No results for input(s): VITAMINB12, FOLATE, FERRITIN, TIBC, IRON, RETICCTPCT in the last 72 hours. Urinalysis    Component Value Date/Time   COLORURINE YELLOW 05/07/2017 0256   APPEARANCEUR CLEAR 05/07/2017 0256   LABSPEC 1.010 05/07/2017 0256   PHURINE 5.0 05/07/2017 0256   GLUCOSEU NEGATIVE 05/07/2017 0256   HGBUR SMALL (A) 05/07/2017 0256   BILIRUBINUR NEGATIVE 05/07/2017 0256   KETONESUR NEGATIVE 05/07/2017 0256   PROTEINUR NEGATIVE 05/07/2017 0256   NITRITE NEGATIVE 05/07/2017 0256   LEUKOCYTESUR NEGATIVE 05/07/2017 0256   Sepsis Labs Invalid input(s): PROCALCITONIN,  WBC,  LACTICIDVEN Microbiology Recent Results (from the past 240 hour(s))  Blood Culture (routine x 2)     Status: None (Preliminary result)   Collection Time: 05/07/17  2:00 AM  Result Value Ref Range Status   Specimen Description BLOOD RIGHT HAND  Final   Special Requests IN PEDIATRIC BOTTLE Blood Culture adequate  volume  Final   Culture   Final    NO GROWTH 2 DAYS Performed at [facility], [place] [address], [place], [place] [address]    Report Status PENDING  Incomplete  Blood Culture (routine x 2)     Status: None (Preliminary result)   Collection Time: 05/07/17  2:02 AM  Result Value Ref Range Status   Specimen Description BLOOD LEFT FOREARM  Final   Special Requests   Final    BOTTLES DRAWN AEROBIC AND ANAEROBIC Blood Culture adequate volume   Culture   Final    NO GROWTH 2 DAYS Performed at [facility], [place] [address] [place], [place] [address]    Report Status PENDING  Incomplete  Culture, expectorated sputum-assessment     Status: None   Collection Time: 05/07/17  4:10 AM  Result Value Ref Range Status   Specimen Description EXPECTORATED SPUTUM  Final   Special Requests NONE  Final   Sputum evaluation   Final    THIS SPECIMEN IS ACCEPTABLE FOR SPUTUM CULTURE Performed at [facility], [place] [address] [place], [place] [id number]    Report Status 05/08/2017 FINAL  Final  Culture, respiratory (NON-Expectorated)     Status: None   Collection Time: 05/07/17  4:10 AM  Result Value Ref Range  Status   Specimen Description EXPECTORATED SPUTUM  Final   Special Requests NONE Reflexed from [id number]  Final   Gram Stain   Final    MODERATE WBC PRESENT, PREDOMINANTLY PMN FEW GRAM POSITIVE COCCI RARE GRAM POSITIVE RODS    Culture   Final    Consistent with normal respiratory flora. Performed at [facility], [place] [address], [place], [place] [address]    Report Status 05/09/2017 FINAL  Final  MRSA PCR Screening     Status: None   Collection Time: 05/07/17  6:24 AM  Result Value Ref Range Status   MRSA by PCR NEGATIVE NEGATIVE Final    Comment:        The GeneXpert MRSA Assay (FDA approved for NASAL specimens only), is one component of a comprehensive MRSA colonization surveillance program. It is not intended to diagnose MRSA infection nor to guide or monitor  treatment for MRSA infections. Performed at [facility], [place] [address] [place], [place] [address]   Respiratory Panel by PCR     Status: Abnormal   Collection Time: 05/07/17  7:24 AM  Result Value Ref Range Status   Adenovirus NOT DETECTED NOT DETECTED Final   Coronavirus 229E DETECTED (A) NOT DETECTED Final   Coronavirus HKU1 NOT DETECTED NOT DETECTED Final   Coronavirus NL63 NOT DETECTED NOT DETECTED Final   Coronavirus OC43 NOT DETECTED NOT DETECTED Final   Metapneumovirus NOT DETECTED NOT DETECTED Final   Rhinovirus / Enterovirus NOT DETECTED NOT DETECTED Final   Influenza A NOT DETECTED NOT DETECTED Final   Influenza B NOT DETECTED NOT DETECTED Final   Parainfluenza Virus 1 NOT DETECTED NOT DETECTED Final   Parainfluenza Virus 2 NOT DETECTED NOT DETECTED Final   Parainfluenza Virus 3 NOT DETECTED NOT DETECTED Final   Parainfluenza Virus 4 NOT DETECTED NOT DETECTED Final   Respiratory Syncytial Virus NOT DETECTED NOT DETECTED Final   Bordetella pertussis NOT DETECTED NOT DETECTED Final   Chlamydophila pneumoniae NOT DETECTED NOT DETECTED Final   Mycoplasma pneumoniae NOT DETECTED NOT DETECTED Final    Comment: Performed at [facility], [place] [address] [place], [place] [address]     SIGNED:   Cordelia Poche, MD [facility] 05/09/2017, 1:21 PM Pager [phone]  If 7PM-7AM, please contact night-coverage [email or web address] Password [email or web address]

## 2017-05-09 NOTE — Progress Notes (Signed)
SATURATION QUALIFICATIONS: (This note is used to comply with regulatory documentation for home oxygen)  Patient Saturations on Room Air at Rest = 85 %   Patient Saturations on Room Air while Ambulating = not atempted  Patient Saturations on 3 Liters of oxygen while Ambulating = 93%  Please briefly explain why patient needs home oxygen:  Patient ambulated 125 ft while on 3L. Saturations maintained 93%. No complaints from patient while ambulating.

## 2017-05-12 LAB — CULTURE, BLOOD (ROUTINE X 2)
CULTURE: NO GROWTH
Culture: NO GROWTH
SPECIAL REQUESTS: ADEQUATE
Special Requests: ADEQUATE

## 2017-06-20 ENCOUNTER — Emergency Department (HOSPITAL_COMMUNITY): Payer: Medicare Other

## 2017-06-20 ENCOUNTER — Inpatient Hospital Stay (HOSPITAL_COMMUNITY)
Admission: EM | Admit: 2017-06-20 | Discharge: 2017-06-25 | DRG: 208 | Disposition: A | Discharge status: Home Health | Payer: Medicare Other | Attending: Pulmonary Disease | Admitting: Pulmonary Disease

## 2017-06-20 DIAGNOSIS — J449 Chronic obstructive pulmonary disease, unspecified: Secondary | ICD-10-CM | POA: Diagnosis not present

## 2017-06-20 DIAGNOSIS — Y92009 Unspecified place in unspecified non-institutional (private) residence as the place of occurrence of the external cause: Secondary | ICD-10-CM

## 2017-06-20 DIAGNOSIS — J9622 Acute and chronic respiratory failure with hypercapnia: Secondary | ICD-10-CM | POA: Diagnosis present

## 2017-06-20 DIAGNOSIS — R402441 Other coma, without documented Glasgow coma scale score, or with partial score reported, in the field [EMT or ambulance]: Secondary | ICD-10-CM | POA: Diagnosis not present

## 2017-06-20 DIAGNOSIS — D638 Anemia in other chronic diseases classified elsewhere: Secondary | ICD-10-CM | POA: Diagnosis present

## 2017-06-20 DIAGNOSIS — Z85118 Personal history of other malignant neoplasm of bronchus and lung: Secondary | ICD-10-CM | POA: Diagnosis not present

## 2017-06-20 DIAGNOSIS — R402222 Coma scale, best verbal response, incomprehensible words, at arrival to emergency department: Secondary | ICD-10-CM | POA: Diagnosis present

## 2017-06-20 DIAGNOSIS — G4733 Obstructive sleep apnea (adult) (pediatric): Secondary | ICD-10-CM | POA: Diagnosis present

## 2017-06-20 DIAGNOSIS — J9620 Acute and chronic respiratory failure, unspecified whether with hypoxia or hypercapnia: Secondary | ICD-10-CM | POA: Diagnosis present

## 2017-06-20 DIAGNOSIS — J9690 Respiratory failure, unspecified, unspecified whether with hypoxia or hypercapnia: Secondary | ICD-10-CM

## 2017-06-20 DIAGNOSIS — G8929 Other chronic pain: Secondary | ICD-10-CM | POA: Diagnosis present

## 2017-06-20 DIAGNOSIS — J9612 Chronic respiratory failure with hypercapnia: Secondary | ICD-10-CM | POA: Diagnosis not present

## 2017-06-20 DIAGNOSIS — R1310 Dysphagia, unspecified: Secondary | ICD-10-CM | POA: Diagnosis not present

## 2017-06-20 DIAGNOSIS — J9600 Acute respiratory failure, unspecified whether with hypoxia or hypercapnia: Secondary | ICD-10-CM

## 2017-06-20 DIAGNOSIS — J439 Emphysema, unspecified: Secondary | ICD-10-CM

## 2017-06-20 DIAGNOSIS — D6489 Other specified anemias: Secondary | ICD-10-CM | POA: Diagnosis present

## 2017-06-20 DIAGNOSIS — G9340 Encephalopathy, unspecified: Secondary | ICD-10-CM | POA: Diagnosis not present

## 2017-06-20 DIAGNOSIS — T380X5A Adverse effect of glucocorticoids and synthetic analogues, initial encounter: Secondary | ICD-10-CM | POA: Diagnosis not present

## 2017-06-20 DIAGNOSIS — R402122 Coma scale, eyes open, to pain, at arrival to emergency department: Secondary | ICD-10-CM | POA: Diagnosis present

## 2017-06-20 DIAGNOSIS — Z7902 Long term (current) use of antithrombotics/antiplatelets: Secondary | ICD-10-CM

## 2017-06-20 DIAGNOSIS — J310 Chronic rhinitis: Secondary | ICD-10-CM | POA: Diagnosis present

## 2017-06-20 DIAGNOSIS — R4020 Unspecified coma: Secondary | ICD-10-CM | POA: Diagnosis not present

## 2017-06-20 DIAGNOSIS — K7581 Nonalcoholic steatohepatitis (NASH): Secondary | ICD-10-CM | POA: Diagnosis present

## 2017-06-20 DIAGNOSIS — J9621 Acute and chronic respiratory failure with hypoxia: Secondary | ICD-10-CM | POA: Diagnosis not present

## 2017-06-20 DIAGNOSIS — D508 Other iron deficiency anemias: Secondary | ICD-10-CM | POA: Diagnosis present

## 2017-06-20 DIAGNOSIS — Z9119 Patient's noncompliance with other medical treatment and regimen: Secondary | ICD-10-CM

## 2017-06-20 DIAGNOSIS — I5032 Chronic diastolic (congestive) heart failure: Secondary | ICD-10-CM | POA: Diagnosis present

## 2017-06-20 DIAGNOSIS — E785 Hyperlipidemia, unspecified: Secondary | ICD-10-CM | POA: Diagnosis present

## 2017-06-20 DIAGNOSIS — K7460 Unspecified cirrhosis of liver: Secondary | ICD-10-CM | POA: Diagnosis present

## 2017-06-20 DIAGNOSIS — Z4682 Encounter for fitting and adjustment of non-vascular catheter: Secondary | ICD-10-CM | POA: Diagnosis not present

## 2017-06-20 DIAGNOSIS — E669 Obesity, unspecified: Secondary | ICD-10-CM | POA: Diagnosis present

## 2017-06-20 DIAGNOSIS — N179 Acute kidney failure, unspecified: Secondary | ICD-10-CM | POA: Diagnosis present

## 2017-06-20 DIAGNOSIS — R402342 Coma scale, best motor response, flexion withdrawal, at arrival to emergency department: Secondary | ICD-10-CM | POA: Diagnosis present

## 2017-06-20 DIAGNOSIS — Z923 Personal history of irradiation: Secondary | ICD-10-CM

## 2017-06-20 DIAGNOSIS — Z8249 Family history of ischemic heart disease and other diseases of the circulatory system: Secondary | ICD-10-CM

## 2017-06-20 DIAGNOSIS — Z6830 Body mass index (BMI) 30.0-30.9, adult: Secondary | ICD-10-CM

## 2017-06-20 DIAGNOSIS — E870 Hyperosmolality and hypernatremia: Secondary | ICD-10-CM | POA: Diagnosis not present

## 2017-06-20 DIAGNOSIS — Z8673 Personal history of transient ischemic attack (TIA), and cerebral infarction without residual deficits: Secondary | ICD-10-CM

## 2017-06-20 DIAGNOSIS — R4182 Altered mental status, unspecified: Secondary | ICD-10-CM

## 2017-06-20 DIAGNOSIS — E1165 Type 2 diabetes mellitus with hyperglycemia: Secondary | ICD-10-CM | POA: Diagnosis not present

## 2017-06-20 DIAGNOSIS — K219 Gastro-esophageal reflux disease without esophagitis: Secondary | ICD-10-CM | POA: Diagnosis present

## 2017-06-20 DIAGNOSIS — G9341 Metabolic encephalopathy: Secondary | ICD-10-CM | POA: Diagnosis present

## 2017-06-20 DIAGNOSIS — I959 Hypotension, unspecified: Secondary | ICD-10-CM | POA: Diagnosis present

## 2017-06-20 DIAGNOSIS — Z4659 Encounter for fitting and adjustment of other gastrointestinal appliance and device: Secondary | ICD-10-CM

## 2017-06-20 DIAGNOSIS — Z7982 Long term (current) use of aspirin: Secondary | ICD-10-CM

## 2017-06-20 DIAGNOSIS — J9692 Respiratory failure, unspecified with hypercapnia: Secondary | ICD-10-CM

## 2017-06-20 DIAGNOSIS — I2510 Atherosclerotic heart disease of native coronary artery without angina pectoris: Secondary | ICD-10-CM | POA: Diagnosis present

## 2017-06-20 DIAGNOSIS — J9602 Acute respiratory failure with hypercapnia: Secondary | ICD-10-CM | POA: Diagnosis not present

## 2017-06-20 DIAGNOSIS — J441 Chronic obstructive pulmonary disease with (acute) exacerbation: Secondary | ICD-10-CM | POA: Diagnosis present

## 2017-06-20 DIAGNOSIS — Z79899 Other long term (current) drug therapy: Secondary | ICD-10-CM

## 2017-06-20 DIAGNOSIS — Z79891 Long term (current) use of opiate analgesic: Secondary | ICD-10-CM

## 2017-06-20 DIAGNOSIS — F17210 Nicotine dependence, cigarettes, uncomplicated: Secondary | ICD-10-CM | POA: Diagnosis present

## 2017-06-20 DIAGNOSIS — R51 Headache: Secondary | ICD-10-CM | POA: Diagnosis not present

## 2017-06-20 DIAGNOSIS — I110 Hypertensive heart disease with heart failure: Secondary | ICD-10-CM | POA: Diagnosis present

## 2017-06-20 DIAGNOSIS — E876 Hypokalemia: Secondary | ICD-10-CM | POA: Diagnosis not present

## 2017-06-20 LAB — I-STAT TROPONIN, ED: TROPONIN I, POC: 0.06 ng/mL (ref 0.00–0.08)

## 2017-06-20 LAB — URINALYSIS, COMPLETE (UACMP) WITH MICROSCOPIC
BILIRUBIN URINE: NEGATIVE
Glucose, UA: NEGATIVE mg/dL
HGB URINE DIPSTICK: NEGATIVE
KETONES UR: NEGATIVE mg/dL
LEUKOCYTES UA: NEGATIVE
Nitrite: NEGATIVE
Protein, ur: 30 mg/dL — AB
SPECIFIC GRAVITY, URINE: 1.02 (ref 1.005–1.030)
pH: 5 (ref 5.0–8.0)

## 2017-06-20 LAB — CBC WITH DIFFERENTIAL/PLATELET
ABS IMMATURE GRANULOCYTES: 0.1 10*3/uL (ref 0.0–0.1)
BASOS PCT: 1 %
Basophils Absolute: 0.1 10*3/uL (ref 0.0–0.1)
Eosinophils Absolute: 0 10*3/uL (ref 0.0–0.7)
Eosinophils Relative: 0 %
HEMATOCRIT: 35.2 % — AB (ref 39.0–52.0)
HEMOGLOBIN: 9 g/dL — AB (ref 13.0–17.0)
Immature Granulocytes: 1 %
LYMPHS ABS: 1 10*3/uL (ref 0.7–4.0)
LYMPHS PCT: 10 %
MCH: 22.3 pg — AB (ref 26.0–34.0)
MCHC: 25.6 g/dL — ABNORMAL LOW (ref 30.0–36.0)
MCV: 87.1 fL (ref 78.0–100.0)
MONOS PCT: 8 %
Monocytes Absolute: 0.8 10*3/uL (ref 0.1–1.0)
NEUTROS ABS: 7.2 10*3/uL (ref 1.7–7.7)
Neutrophils Relative %: 80 %
PLATELETS: 302 10*3/uL (ref 150–400)
RBC: 4.04 MIL/uL — ABNORMAL LOW (ref 4.22–5.81)
RDW: 16 % — ABNORMAL HIGH (ref 11.5–15.5)
WBC: 9.1 10*3/uL (ref 4.0–10.5)

## 2017-06-20 LAB — PROTIME-INR
INR: 1.02
Prothrombin Time: 13.3 seconds (ref 11.4–15.2)

## 2017-06-20 LAB — RAPID URINE DRUG SCREEN, HOSP PERFORMED
AMPHETAMINES: NOT DETECTED
BENZODIAZEPINES: NOT DETECTED
Barbiturates: NOT DETECTED
Cocaine: NOT DETECTED
Opiates: POSITIVE — AB
Tetrahydrocannabinol: POSITIVE — AB

## 2017-06-20 LAB — COMPREHENSIVE METABOLIC PANEL
ALK PHOS: 110 U/L (ref 38–126)
ALT: 12 U/L — ABNORMAL LOW (ref 17–63)
ANION GAP: 6 (ref 5–15)
AST: 20 U/L (ref 15–41)
Albumin: 3.3 g/dL — ABNORMAL LOW (ref 3.5–5.0)
BUN: 13 mg/dL (ref 6–20)
CALCIUM: 9.1 mg/dL (ref 8.9–10.3)
CHLORIDE: 90 mmol/L — AB (ref 101–111)
CO2: 46 mmol/L — AB (ref 22–32)
Creatinine, Ser: 1.4 mg/dL — ABNORMAL HIGH (ref 0.61–1.24)
GFR calc non Af Amer: 52 mL/min — ABNORMAL LOW (ref >60)
GLUCOSE: 189 mg/dL — AB (ref 65–99)
POTASSIUM: 3.8 mmol/L (ref 3.5–5.1)
SODIUM: 142 mmol/L (ref 135–145)
Total Bilirubin: 0.5 mg/dL (ref 0.3–1.2)
Total Protein: 7.6 g/dL (ref 6.5–8.1)

## 2017-06-20 LAB — I-STAT CHEM 8, ED
BUN: 16 mg/dL (ref 6–20)
CREATININE: 1.3 mg/dL — AB (ref 0.61–1.24)
Calcium, Ion: 1.1 mmol/L — ABNORMAL LOW (ref 1.15–1.40)
Chloride: 90 mmol/L — ABNORMAL LOW (ref 101–111)
Glucose, Bld: 183 mg/dL — ABNORMAL HIGH (ref 65–99)
HCT: 33 % — ABNORMAL LOW (ref 39.0–52.0)
HEMOGLOBIN: 11.2 g/dL — AB (ref 13.0–17.0)
Potassium: 3.7 mmol/L (ref 3.5–5.1)
SODIUM: 142 mmol/L (ref 135–145)
TCO2: 43 mmol/L — AB (ref 22–32)

## 2017-06-20 LAB — I-STAT ARTERIAL BLOOD GAS, ED
Acid-Base Excess: 20 mmol/L — ABNORMAL HIGH (ref 0.0–2.0)
Bicarbonate: 49.9 mmol/L — ABNORMAL HIGH (ref 20.0–28.0)
O2 SAT: 100 %
PCO2 ART: 88.1 mmHg — AB (ref 32.0–48.0)
PH ART: 7.361 (ref 7.350–7.450)
Patient temperature: 98.6
TCO2: >50 mmol/L — ABNORMAL HIGH (ref 22–32)
pO2, Arterial: 547 mmHg — ABNORMAL HIGH (ref 83.0–108.0)

## 2017-06-20 LAB — ETHANOL: Alcohol, Ethyl (B): <10 mg/dL (ref <10)

## 2017-06-20 LAB — APTT: APTT: 31 s (ref 24–36)

## 2017-06-20 MED ORDER — ROCURONIUM BROMIDE 50 MG/5ML IV SOLN
INTRAVENOUS | Status: AC | PRN
  Administered 2017-06-20: 75 mg via INTRAVENOUS

## 2017-06-20 MED ORDER — FENTANYL 2500MCG IN NS 250ML (10MCG/ML) PREMIX INFUSION
25.0000 ug/h | INTRAVENOUS | Status: DC
  Administered 2017-06-21: 50 ug/h via INTRAVENOUS
  Administered 2017-06-21: 175 ug/h via INTRAVENOUS
  Administered 2017-06-21: 50 ug/h via INTRAVENOUS
  Filled 2017-06-20 (×2): qty 250

## 2017-06-20 MED ORDER — SODIUM CHLORIDE 0.9 % IV SOLN: 250.0000 mL | INTRAVENOUS | Status: DC | PRN

## 2017-06-20 MED ORDER — IPRATROPIUM-ALBUTEROL 0.5-2.5 (3) MG/3ML IN SOLN
3.0000 mL | Freq: Four times a day (QID) | RESPIRATORY_TRACT | Status: DC
  Administered 2017-06-21 – 2017-06-24 (×13): 3 mL via RESPIRATORY_TRACT
  Filled 2017-06-20 (×12): qty 3

## 2017-06-20 MED ORDER — INSULIN ASPART 100 UNIT/ML ~~LOC~~ SOLN
0.0000 [IU] | SUBCUTANEOUS | Status: DC
  Administered 2017-06-21: 3 [IU] via SUBCUTANEOUS
  Administered 2017-06-21: 1 [IU] via SUBCUTANEOUS
  Administered 2017-06-21 – 2017-06-22 (×2): 3 [IU] via SUBCUTANEOUS
  Administered 2017-06-22: 2 [IU] via SUBCUTANEOUS
  Administered 2017-06-22 (×2): 3 [IU] via SUBCUTANEOUS
  Administered 2017-06-22: 2 [IU] via SUBCUTANEOUS
  Administered 2017-06-22 (×2): 3 [IU] via SUBCUTANEOUS
  Administered 2017-06-23: 2 [IU] via SUBCUTANEOUS
  Administered 2017-06-23: 3 [IU] via SUBCUTANEOUS
  Administered 2017-06-23 – 2017-06-24 (×2): 5 [IU] via SUBCUTANEOUS
  Administered 2017-06-24: 2 [IU] via SUBCUTANEOUS
  Administered 2017-06-24: 3 [IU] via SUBCUTANEOUS

## 2017-06-20 MED ORDER — ETOMIDATE 2 MG/ML IV SOLN
0.3000 mg/kg | Freq: Once | INTRAVENOUS | Status: AC
  Administered 2017-06-20: 22.6 mg via INTRAVENOUS

## 2017-06-20 MED ORDER — ROCURONIUM BROMIDE 50 MG/5ML IV SOLN
1.0000 mg/kg | Freq: Once | INTRAVENOUS | Status: AC
  Administered 2017-06-20: 75.3 mg via INTRAVENOUS

## 2017-06-20 MED ORDER — HEPARIN SODIUM (PORCINE) 5000 UNIT/ML IJ SOLN
5000.0000 [IU] | Freq: Three times a day (TID) | INTRAMUSCULAR | Status: DC
  Administered 2017-06-21 – 2017-06-25 (×12): 5000 [IU] via SUBCUTANEOUS
  Filled 2017-06-20 (×10): qty 1

## 2017-06-20 MED ORDER — NALOXONE HCL 0.4 MG/ML IJ SOLN
INTRAMUSCULAR | Status: AC
  Filled 2017-06-20: qty 1

## 2017-06-20 MED ORDER — ETOMIDATE 2 MG/ML IV SOLN
INTRAVENOUS | Status: AC | PRN
  Administered 2017-06-20: 20 mg via INTRAVENOUS

## 2017-06-20 MED ORDER — FENTANYL CITRATE (PF) 100 MCG/2ML IJ SOLN
50.0000 ug | Freq: Once | INTRAMUSCULAR | Status: AC
  Administered 2017-06-20: 50 ug via INTRAVENOUS
  Filled 2017-06-20: qty 2

## 2017-06-20 MED ORDER — MIDAZOLAM HCL 2 MG/2ML IJ SOLN: 2.0000 mg | INTRAMUSCULAR | Status: DC | PRN

## 2017-06-20 MED ORDER — NALOXONE HCL 0.4 MG/ML IJ SOLN: 0.2000 mg | Freq: Once | INTRAMUSCULAR | Status: DC

## 2017-06-20 MED ORDER — FENTANYL BOLUS VIA INFUSION
25.0000 ug | INTRAVENOUS | Status: DC | PRN
  Administered 2017-06-21 – 2017-06-22 (×4): 25 ug via INTRAVENOUS
  Filled 2017-06-20: qty 25

## 2017-06-20 MED ORDER — ONDANSETRON HCL 4 MG/2ML IJ SOLN: 4.0000 mg | Freq: Once | INTRAMUSCULAR | Status: DC

## 2017-06-20 MED ORDER — MIDAZOLAM HCL 2 MG/2ML IJ SOLN
2.0000 mg | INTRAMUSCULAR | Status: DC | PRN
  Filled 2017-06-20: qty 2

## 2017-06-20 MED ORDER — PANTOPRAZOLE SODIUM 40 MG PO PACK: 40.0000 mg | PACK | Freq: Every day | ORAL | Status: DC

## 2017-06-20 MED ORDER — BUDESONIDE 0.5 MG/2ML IN SUSP
0.5000 mg | Freq: Two times a day (BID) | RESPIRATORY_TRACT | Status: DC
  Administered 2017-06-21 – 2017-06-25 (×9): 0.5 mg via RESPIRATORY_TRACT
  Filled 2017-06-20 (×10): qty 2

## 2017-06-20 MED ORDER — PROPOFOL 1000 MG/100ML IV EMUL
5.0000 ug/kg/min | Freq: Once | INTRAVENOUS | Status: DC
  Filled 2017-06-20: qty 100

## 2017-06-20 MED ORDER — ONDANSETRON HCL 4 MG/2ML IJ SOLN
INTRAMUSCULAR | Status: AC
  Filled 2017-06-20: qty 2

## 2017-06-20 MED ORDER — ARFORMOTEROL TARTRATE 15 MCG/2ML IN NEBU
15.0000 ug | INHALATION_SOLUTION | Freq: Two times a day (BID) | RESPIRATORY_TRACT | Status: DC
  Administered 2017-06-21 – 2017-06-25 (×9): 15 ug via RESPIRATORY_TRACT
  Filled 2017-06-20 (×10): qty 2

## 2017-06-20 NOTE — ED Notes (Signed)
Critical ABG result given to Dr Rogene Houston.

## 2017-06-20 NOTE — ED Notes (Signed)
ETT pulled back 2cm and secured 25cm at lip.  BBS equal, vent tidal volumes verified.

## 2017-06-20 NOTE — ED Triage Notes (Signed)
Per EMS, patient here for altered mental status x3-4 days. Narcan .02mg  given upon arrival.

## 2017-06-20 NOTE — ED Notes (Signed)
Post Narcan, patient continued to be barely responsive but was able to answer to name. Patient continued to go in and out of consciousness. Decision made to protect patient's airway to intubate.

## 2017-06-20 NOTE — ED Provider Notes (Signed)
[facility] EMERGENCY DEPARTMENT Provider Note   CSN: [id number] Arrival date & time: 06/20/17  2139     History   Chief Complaint Chief Complaint  Patient presents with  . Altered Mental Status    HPI Manuel Baxter is a 62 y.o. male with a history of COPD, requiring intubation and admission on 05/06/2017, who presents today from home.  His wife provides history.  She reports that he has not been himself recently, has been less interactive although this appears to be intermittent.  She reports that this has gotten significantly worse over the past 3 days.  She reports that he has been taking his medications.  She denies any other complaints at home.  Further HPI limited secondary to patient nonverbal, wife unable to give further information.  HPI  Past Medical History:  Diagnosis Date  . Cirrhosis of liver not due to alcohol ([place]) 2001   per wife  . COPD (chronic obstructive pulmonary disease) ([place])   . Lung cancer [facility])     Patient Active Problem List   Diagnosis Date Noted  . Pressure injury of skin 05/07/2017  . Acute respiratory failure with hypoxia ([place]) 04/24/2017  . Diabetes mellitus type 2 in nonobese ([place]) 04/24/2017  . Essential hypertension 04/24/2017  . Normochromic normocytic anemia 04/24/2017  . Acute respiratory failure with hypercapnia ([place])   . COPD exacerbation ([place])   . Acute on chronic respiratory failure ([place]) 02/06/2017    No past surgical history on file.      Home Medications    Prior to Admission medications   Medication Sig Start Date End Date Taking? Authorizing Provider  albuterol (PROAIR HFA) 108 (90 Base) MCG/ACT inhaler Inhale 2 puffs into the lungs every 6 (six) hours as needed for wheezing or shortness of breath.    [provider]  Alum Hydroxide-Mag Carbonate (GAVISCON PO) Take 1 tablet by mouth daily as needed (indigestion/heartburn).    [provider]  aspirin EC 81 MG tablet Take 81 mg by  mouth daily.    [provider]  Aspirin-Caffeine (BC FAST PAIN RELIEF ARTHRITIS) 1000-65 MG PACK Take 2 packets by mouth every 4 (four) hours as needed (pain/headache).    [provider]  atenolol (TENORMIN) 50 MG tablet Take 50 mg by mouth 2 (two) times daily.    [provider]  Cholecalciferol (VITAMIN D) 2000 units tablet Take 2,000 Units by mouth 2 (two) times daily.    [provider]  clopidogrel (PLAVIX) 75 MG tablet Take 75 mg by mouth daily with supper.    [provider]  cyanocobalamin 500 MCG tablet Take 500 mcg by mouth 2 (two) times daily. Vitamin B12    [provider]  DULoxetine (CYMBALTA) 30 MG capsule Take 30 mg by mouth daily.    [provider]  fluticasone (FLONASE) 50 MCG/ACT nasal spray Place 1 spray into both nostrils daily.    [provider]  furosemide (LASIX) 40 MG tablet Take 1 tablet (40 mg total) by mouth daily. 04/28/17   Rosita Fire, MD  glipiZIDE (GLUCOTROL) 5 MG tablet Take 2.5 mg by mouth 2 (two) times daily before a meal.    [provider]  HYDROmorphone (DILAUDID) 4 MG tablet Take 4 mg by mouth every 6 (six) hours as needed (chronic pain).    [provider]  ipratropium-albuterol (DUONEB) 0.5-2.5 (3) MG/3ML SOLN Take 3 mLs by nebulization every 6 (six) hours. 02/10/17   Lavina Hamman,  MD  loratadine (CLARITIN) 10 MG tablet Take 10 mg by mouth daily.    [provider]  montelukast (SINGULAIR) 10 MG tablet Take 10 mg by mouth daily with supper.    [provider]  Omega-3 Fatty Acids (FISH OIL) 1000 MG CAPS Take 1,000-2,000 mg by mouth See admin instructions. Take 2 capsules (2000 mg) by mouth every morning and 1 capsule (1000 mg) before supper    [provider]  omeprazole (PRILOSEC) 20 MG capsule Take 20 mg by mouth daily.    [provider]  OVER THE COUNTER MEDICATION Place 0.5-1 mLs under the tongue See admin  instructions. [employer] - place 1/2 dropperful (0.5 ml) under the tongue twice during the day, place 1 dropperful (1 ml) under the tongue at bedtime. 1 ml - 250 mg hemp, 405 mg linoleic acid, 135 mg Alpha Linoleic Acid, 90 m Oleic Acid.    [provider]  tiotropium (SPIRIVA) 18 MCG inhalation capsule Place 18 mcg into inhaler and inhale daily.     [provider]    Family History Family History  Problem Relation Age of Onset  . Hypertension Other     Social History Social History   Tobacco Use  . Smoking status: Current Every Day Smoker    Packs/day: 1.00    Types: Cigarettes  . Smokeless tobacco: Never Used  Substance Use Topics  . Alcohol use: No    Frequency: Never  . Drug use: No     Allergies   Doxepin; Lisinopril; Acetaminophen; Contrast media [iodinated diagnostic agents]; Gabapentin; Lyrica [pregabalin]; and Prozac [fluoxetine hcl]   Review of Systems Review of Systems  Unable to perform ROS: Mental status change     Physical Exam Updated Vital Signs BP 128/82   Pulse 81   Temp 97.6 F (36.4 C) (Oral)   Resp 20   Wt 75.3 kg (166 lb 0.1 oz)   SpO2 100%   BMI 26.00 kg/m   Physical Exam  Constitutional: He appears well-developed. He appears distressed.  HENT:  Head: Normocephalic and atraumatic.  Eyes:  Pupils initially pinpoint bilaterally, not obvious responsive.  After Narcan they improved to approximately 4 mm bilaterally responsive to light.  Neck: No tracheal deviation present.  Cardiovascular: Normal rate, normal heart sounds and intact distal pulses.  Pulmonary/Chest: He is in respiratory distress.  Generalized decreased air movement, Rales present bilaterally.  Patient is breathing rapidly and shallow.  Abdominal: Soft. He exhibits no distension. There is no guarding.  Genitourinary: Penis normal.  Musculoskeletal:  Moves all 4 extremities spontaneously  Neurological: GCS eye subscore is 2. GCS verbal  subscore is 2. GCS motor subscore is 4.  Patient briefly became more awake after Narcan administration, however then quickly went back to requiring significant pain/sternal rub to be awakened.  Skin: Skin is warm. He is diaphoretic.  Nursing note and vitals reviewed.    ED Treatments / Results  Labs (all labs ordered are listed, but only abnormal results are displayed) Labs Reviewed  CBC WITH DIFFERENTIAL/PLATELET - Abnormal; Notable for the following components:      Result Value   RBC 4.04 (*)    Hemoglobin 9.0 (*)    HCT 35.2 (*)    MCH 22.3 (*)    MCHC 25.6 (*)    RDW 16.0 (*)    All other components within normal limits  CULTURE, BLOOD (ROUTINE X 2)  CULTURE, BLOOD (ROUTINE X 2)  URINE CULTURE  COMPREHENSIVE METABOLIC PANEL  URINALYSIS, COMPLETE (UACMP) WITH MICROSCOPIC  AMMONIA  ETHANOL  RAPID URINE DRUG SCREEN, HOSP PERFORMED  APTT  PROTIME-INR  BRAIN NATRIURETIC PEPTIDE  I-STAT CHEM 8, ED  I-STAT ARTERIAL BLOOD GAS, ED    EKG EKG Interpretation  Date/Time:  Saturday Jun 20 2017 21:56:24 EDT Ventricular Rate:  88 PR Interval:    QRS Duration: 97 QT Interval:  366 QTC Calculation: 443 R Axis:   90 Text Interpretation:  Sinus rhythm Borderline right axis deviation Nonspecific repol abnormality, diffuse leads Interpretation limited secondary to artifact Confirmed by Fredia Sorrow [phone]) on 06/20/2017 9:58:40 PM   Radiology Dg Chest Portable 1 View  Result Date: 06/20/2017 CLINICAL DATA:  ET tube placement EXAM: PORTABLE CHEST 1 VIEW COMPARISON:  05/08/2017 FINDINGS: Endotracheal tube is 5 cm above the carina. Cardiomegaly. Soft tissue prominence in the region of the right hilum and perihilar region. Increased markings in the lung bases. No visible effusions. IMPRESSION: Endotracheal tube 5 cm above the carina. Right perihilar soft tissue fullness, cannot exclude perihilar mass. This could be further evaluated with chest CT with IV contrast. Increased markings  in the lung bases, likely chronic interstitial changes. Electronically Signed   By: Rolm Baptise M.D.   On: 06/20/2017 22:55    Procedures Procedure Name: Intubation Date/Time: 06/20/2017 10:56 PM Performed by: Lorin Glass, PA-C Pre-anesthesia Checklist: Patient identified, Patient being monitored, Emergency Drugs available, Timeout performed and Suction available Oxygen Delivery Method: Ambu bag Preoxygenation: Pre-oxygenation with 100% oxygen Induction Type: Rapid sequence Ventilation: Mask ventilation without difficulty Laryngoscope Size: Glidescope Grade View: Grade II Tube size: 7.5 mm Number of attempts: 1 Airway Equipment and Method: Video-laryngoscopy and Stylet Placement Confirmation: ETT inserted through vocal cords under direct vision,  CO2 detector and Breath sounds checked- equal and bilateral Secured at: 26 cm Tube secured with: ETT holder Dental Injury: Teeth and Oropharynx as per pre-operative assessment  Comments: Preoxygenation performed with nonrebreather, BiPAP mask, and then Ambu bag.      (including critical care time)  CRITICAL CARE Performed by: Wyn Quaker Total critical care time: 70 minutes Critical care time was exclusive of separately billable procedures and treating other patients. Critical care was necessary to treat or prevent imminent or life-threatening deterioration. Critical care was time spent personally by me on the following activities: development of treatment plan with patient and/or surrogate as well as nursing, discussions with consultants, evaluation of patient's response to treatment, examination of patient, obtaining history from patient or surrogate, ordering and performing treatments and interventions, ordering and review of laboratory studies, ordering and review of radiographic studies, pulse oximetry and re-evaluation of patient's condition.   Medications Ordered in ED Medications  naloxone (NARCAN) 0.4 MG/ML  injection (has no administration in time range)  ondansetron (ZOFRAN) 4 MG/2ML injection (has no administration in time range)  rocuronium (ZEMURON) injection 75.3 mg (has no administration in time range)  etomidate (AMIDATE) injection 22.6 mg (has no administration in time range)  propofol (DIPRIVAN) 1000 MG/100ML infusion (has no administration in time range)  etomidate (AMIDATE) injection (20 mg Intravenous Given 06/20/17 2223)  rocuronium (ZEMURON) injection (75 mg Intravenous Given 06/20/17 2224)     Initial Impression / Assessment and Plan / ED Course  I have reviewed the triage vital signs and the nursing notes.  Pertinent labs & imaging results that were available during my care of the patient were reviewed by me and considered in my medical decision making (see chart for details).  Clinical Course as of Jun 22 1019  Sat Jun 20, 2017  2251 Spoke with Critical care who reports they will "put patient on the list."   [EH]  2259 Eosinophil: 0 [EH]  2327 On vent, this is after being on vent for about 1 hour   pCO2 arterial(!!): 88.1 [EH]    Clinical Course User Index [EH] Lorin Glass, PA-C   Patient presents today from home for evaluation of altered mental status.  Upon arrival patient is unable to contribute meaningfully to history.  He briefly became more interactive after getting 0.02 mg of Narcan, with improved pupillary response, however then again rapidly became unresponsive.  BiPAP was attempted, however patient did not rapidly improve.  The decision was made to intubate patient as he was rapidly becoming harder to arouse.  I spoke with the patient's wife who states that he would wish to be placed on a ventilator.  Intubation was performed by myself with Dr. Rogene Houston in the room.  Placement was confirmed with color change, fogging tube, and direct visualization using glide scope.  Patient was placed on ventilator.  OG tube attempted by RN, unsuccessful, attempted with  visualization through glide scope, however unable to pass.  Critical care was consulted who agreed to come see patient.  Orders placed for post intubation sedation with propofol, pain control with fentanyl. Post intubation ABG with elevated Pco2 of 88.1.    This patient was seen as a shared visit with Dr. Rogene Houston who evaluated the patient and agreed with my plan.  Patient to be admitted to ICU.   Final Clinical Impressions(s) / ED Diagnoses   Final diagnoses:  Altered mental status, unspecified altered mental status type  Respiratory failure with hypercapnia, unspecified chronicity [facility])    ED Discharge Orders    None       Lorin Glass, PA-C 06/21/17 1173    Fredia Sorrow, MD 06/25/17 [phone]

## 2017-06-21 ENCOUNTER — Inpatient Hospital Stay (HOSPITAL_COMMUNITY): Payer: Medicare Other

## 2017-06-21 DIAGNOSIS — I959 Hypotension, unspecified: Secondary | ICD-10-CM

## 2017-06-21 DIAGNOSIS — J9622 Acute and chronic respiratory failure with hypercapnia: Secondary | ICD-10-CM

## 2017-06-21 LAB — TROPONIN I
Troponin I: 0.25 ng/mL (ref <0.03)
Troponin I: 0.7 ng/mL (ref <0.03)
Troponin I: 0.33 ng/mL (ref <0.03)

## 2017-06-21 LAB — I-STAT ARTERIAL BLOOD GAS, ED
ACID-BASE EXCESS: 17 mmol/L — AB (ref 0.0–2.0)
BICARBONATE: 45.1 mmol/L — AB (ref 20.0–28.0)
O2 Saturation: 95 %
TCO2: 47 mmol/L — AB (ref 22–32)
pCO2 arterial: 73.9 mmHg (ref 32.0–48.0)
pH, Arterial: 7.394 (ref 7.350–7.450)
pO2, Arterial: 81 mmHg — ABNORMAL LOW (ref 83.0–108.0)

## 2017-06-21 LAB — BASIC METABOLIC PANEL
Anion gap: 10 (ref 5–15)
BUN: 16 mg/dL (ref 6–20)
CALCIUM: 8.8 mg/dL — AB (ref 8.9–10.3)
CO2: 39 mmol/L — AB (ref 22–32)
CREATININE: 1.26 mg/dL — AB (ref 0.61–1.24)
Chloride: 92 mmol/L — ABNORMAL LOW (ref 101–111)
GFR calc non Af Amer: 59 mL/min — ABNORMAL LOW (ref >60)
GLUCOSE: 171 mg/dL — AB (ref 65–99)
Potassium: 4.1 mmol/L (ref 3.5–5.1)
Sodium: 141 mmol/L (ref 135–145)

## 2017-06-21 LAB — BLOOD GAS, ARTERIAL
ACID-BASE EXCESS: 12.6 mmol/L — AB (ref 0.0–2.0)
Bicarbonate: 37.5 mmol/L — ABNORMAL HIGH (ref 20.0–28.0)
Drawn by: 36529
FIO2: 0.35
LHR: 22 {breaths}/min
MECHVT: 510 mL
O2 Saturation: 90.4 %
PEEP/CPAP: 5 cmH2O
Patient temperature: 98.6
pCO2 arterial: 56.8 mmHg — ABNORMAL HIGH (ref 32.0–48.0)
pH, Arterial: 7.435 (ref 7.350–7.450)
pO2, Arterial: 59.4 mmHg — ABNORMAL LOW (ref 83.0–108.0)

## 2017-06-21 LAB — STREP PNEUMONIAE URINARY ANTIGEN: Strep Pneumo Urinary Antigen: NEGATIVE

## 2017-06-21 LAB — GLUCOSE, CAPILLARY
GLUCOSE-CAPILLARY: 111 mg/dL — AB (ref 65–99)
GLUCOSE-CAPILLARY: 185 mg/dL — AB (ref 65–99)
GLUCOSE-CAPILLARY: 192 mg/dL — AB (ref 65–99)
GLUCOSE-CAPILLARY: 197 mg/dL — AB (ref 65–99)
Glucose-Capillary: 147 mg/dL — ABNORMAL HIGH (ref 65–99)
Glucose-Capillary: 102 mg/dL — ABNORMAL HIGH (ref 65–99)
Glucose-Capillary: 184 mg/dL — ABNORMAL HIGH (ref 65–99)

## 2017-06-21 LAB — BRAIN NATRIURETIC PEPTIDE: B Natriuretic Peptide: 289.9 pg/mL — ABNORMAL HIGH (ref 0.0–100.0)

## 2017-06-21 LAB — LACTIC ACID, PLASMA
LACTIC ACID, VENOUS: 0.9 mmol/L (ref 0.5–1.9)
Lactic Acid, Venous: 1.3 mmol/L (ref 0.5–1.9)
Lactic Acid, Venous: 1 mmol/L (ref 0.5–1.9)

## 2017-06-21 LAB — RESPIRATORY PANEL BY PCR
Adenovirus: NOT DETECTED
BORDETELLA PERTUSSIS-RVPCR: NOT DETECTED
Chlamydophila pneumoniae: NOT DETECTED
Coronavirus 229E: NOT DETECTED
Coronavirus HKU1: NOT DETECTED
Coronavirus NL63: NOT DETECTED
Coronavirus OC43: NOT DETECTED
INFLUENZA B-RVPPCR: NOT DETECTED
Influenza A: NOT DETECTED
Metapneumovirus: NOT DETECTED
Mycoplasma pneumoniae: NOT DETECTED
PARAINFLUENZA VIRUS 1-RVPPCR: NOT DETECTED
PARAINFLUENZA VIRUS 2-RVPPCR: NOT DETECTED
PARAINFLUENZA VIRUS 4-RVPPCR: NOT DETECTED
Parainfluenza Virus 3: NOT DETECTED
RESPIRATORY SYNCYTIAL VIRUS-RVPPCR: NOT DETECTED
RHINOVIRUS / ENTEROVIRUS - RVPPCR: NOT DETECTED

## 2017-06-21 LAB — PROCALCITONIN
Procalcitonin: <0.1 ng/mL
Procalcitonin: <0.1 ng/mL

## 2017-06-21 LAB — CBC
HCT: 31 % — ABNORMAL LOW (ref 39.0–52.0)
Hemoglobin: 8.2 g/dL — ABNORMAL LOW (ref 13.0–17.0)
MCH: 22.6 pg — AB (ref 26.0–34.0)
MCHC: 26.5 g/dL — AB (ref 30.0–36.0)
MCV: 85.4 fL (ref 78.0–100.0)
Platelets: 232 10*3/uL (ref 150–400)
RBC: 3.63 MIL/uL — ABNORMAL LOW (ref 4.22–5.81)
RDW: 15.9 % — ABNORMAL HIGH (ref 11.5–15.5)
WBC: 9.4 10*3/uL (ref 4.0–10.5)

## 2017-06-21 LAB — AMMONIA: Ammonia: 46 umol/L — ABNORMAL HIGH (ref 9–35)

## 2017-06-21 LAB — MAGNESIUM: Magnesium: 2 mg/dL (ref 1.7–2.4)

## 2017-06-21 LAB — PHOSPHORUS: Phosphorus: 2.2 mg/dL — ABNORMAL LOW (ref 2.5–4.6)

## 2017-06-21 LAB — CBG MONITORING, ED: GLUCOSE-CAPILLARY: 180 mg/dL — AB (ref 65–99)

## 2017-06-21 MED ORDER — LACTULOSE 10 GM/15ML PO SOLN
20.0000 g | Freq: Two times a day (BID) | ORAL | Status: DC
  Administered 2017-06-21 – 2017-06-22 (×3): 20 g
  Filled 2017-06-21 (×3): qty 30

## 2017-06-21 MED ORDER — CHLORHEXIDINE GLUCONATE 0.12% ORAL RINSE (MEDLINE KIT): 15.0000 mL | Freq: Two times a day (BID) | OROMUCOSAL | Status: DC

## 2017-06-21 MED ORDER — CLOPIDOGREL BISULFATE 75 MG PO TABS
75.0000 mg | ORAL_TABLET | Freq: Every day | ORAL | Status: DC
  Administered 2017-06-21 – 2017-06-25 (×5): 75 mg via ORAL
  Filled 2017-06-21 (×5): qty 1

## 2017-06-21 MED ORDER — SODIUM CHLORIDE 0.9 % IV SOLN
INTRAVENOUS | Status: DC
  Administered 2017-06-21 (×2): via INTRAVENOUS

## 2017-06-21 MED ORDER — FENTANYL BOLUS VIA INFUSION
50.0000 ug | Freq: Once | INTRAVENOUS | Status: AC
  Administered 2017-06-21: 50 ug via INTRAVENOUS

## 2017-06-21 MED ORDER — ORAL CARE MOUTH RINSE
15.0000 mL | OROMUCOSAL | Status: DC
  Administered 2017-06-21 – 2017-06-22 (×10): 15 mL via OROMUCOSAL

## 2017-06-21 MED ORDER — FAMOTIDINE IN NACL 20-0.9 MG/50ML-% IV SOLN
20.0000 mg | Freq: Two times a day (BID) | INTRAVENOUS | Status: DC
  Administered 2017-06-21: 20 mg via INTRAVENOUS
  Filled 2017-06-21: qty 50

## 2017-06-21 MED ORDER — ASPIRIN 81 MG PO CHEW
81.0000 mg | CHEWABLE_TABLET | Freq: Every day | ORAL | Status: DC
  Administered 2017-06-21 – 2017-06-25 (×5): 81 mg via ORAL
  Filled 2017-06-21 (×5): qty 1

## 2017-06-21 MED ORDER — CHLORHEXIDINE GLUCONATE 0.12% ORAL RINSE (MEDLINE KIT)
15.0000 mL | Freq: Two times a day (BID) | OROMUCOSAL | Status: DC
  Administered 2017-06-21 – 2017-06-24 (×6): 15 mL via OROMUCOSAL

## 2017-06-21 MED ORDER — FAMOTIDINE 40 MG/5ML PO SUSR
20.0000 mg | Freq: Two times a day (BID) | ORAL | Status: DC
  Administered 2017-06-21 – 2017-06-22 (×2): 20 mg via ORAL
  Filled 2017-06-21 (×2): qty 2.5

## 2017-06-21 MED ORDER — SODIUM CHLORIDE 0.9 % IV BOLUS
1000.0000 mL | Freq: Once | INTRAVENOUS | Status: AC
  Administered 2017-06-21: 1000 mL via INTRAVENOUS

## 2017-06-21 MED ORDER — METHYLPREDNISOLONE SODIUM SUCC 125 MG IJ SOLR
60.0000 mg | Freq: Four times a day (QID) | INTRAMUSCULAR | Status: DC
  Administered 2017-06-21 – 2017-06-22 (×5): 60 mg via INTRAVENOUS
  Filled 2017-06-21 (×5): qty 2

## 2017-06-21 MED ORDER — SODIUM CHLORIDE 0.9 % IV BOLUS
500.0000 mL | Freq: Once | INTRAVENOUS | Status: AC
  Administered 2017-06-21: 500 mL via INTRAVENOUS

## 2017-06-21 MED ORDER — ORAL CARE MOUTH RINSE: 15.0000 mL | OROMUCOSAL | Status: DC

## 2017-06-21 MED ORDER — PIPERACILLIN-TAZOBACTAM 3.375 G IVPB
3.3750 g | Freq: Three times a day (TID) | INTRAVENOUS | Status: DC
Start: 2017-06-21 — End: 2017-06-23
  Administered 2017-06-21 – 2017-06-23 (×5): 3.375 g via INTRAVENOUS
  Filled 2017-06-21 (×7): qty 50

## 2017-06-21 NOTE — Progress Notes (Signed)
Received pt from emergency dept. Pt anxious responds to voice, follows commands, orally intubated, fentanyl drip at 25 mcg LPIV. Jody elink nurse informed of sbp 80s, Jody will notify elink Dr Deterding.

## 2017-06-21 NOTE — Progress Notes (Addendum)
PULMONARY / CRITICAL CARE MEDICINE   Name: Manuel Baxter MRN: [id number] DOB: 09/26/1955    ADMISSION DATE:  06/20/2017 CONSULTATION DATE:  06/20/2017  REFERRING MD:  Phylliss Bob, [place]   CHIEF COMPLAINT:  AMS  HISTORY OF PRESENT ILLNESS:   62 year old male with PMH of NASH, COPD (FEV1 23%), on 2.5-3L [place] at baseline, Stage 1 NSCLC s/p Radiation (2016), OSA (not compliant with CPAP), CAD, DM, HTN, HLD   Recent Admission 4/4-4/6 for Hypoxic/Hypercarbic Respiratory Failure requiring intubation   5/18 presents from home with AMS. Wife reports that patient has been increasingly lethargic throughout the day. For the last two days had "shaking" of extremities in which she reports he has had before with hypercarbia. No fever, chills, or sick contacts. Does report that patient will frequently not wear CPAP at HS. On arrival to ED patient was non-responsive requiring intubation. ABG 7.361/88.1/547. PCCM asked to admit.   SUBJECTIVE:  Admitted overnight. Hemodynamically stable.   VITAL SIGNS: BP (!) 104/57 (BP Location: Left Arm)   Pulse 85   Temp 99.2 F (37.3 C) (Oral)   Resp (!) 22   Ht 5\' 6"  (1.676 m)   Wt 79.5 kg (175 lb 4.3 oz)   SpO2 91%   BMI 28.29 kg/m   HEMODYNAMICS:    VENTILATOR SETTINGS: Vent Mode: PRVC FiO2 (%):  [35 %-100 %] 35 % Set Rate:  [16 bmp-22 bmp] 22 bmp Vt Set:  [510 mL] 510 mL PEEP:  [5 cmH20] 5 cmH20 Plateau Pressure:  [25 PIR51-88 cmH20] 31 cmH20  INTAKE / OUTPUT: I/O last 3 completed shifts: In: 1062.5 [I.V.:62.5; IV Piggyback:1000] Out: 80 [Urine:80]  PHYSICAL EXAMINATION: General:  Obese adult male on vent  Neuro:  Opens eyes to physical stimulation, does not follow commands, pupils intact  HEENT:  ETT in place  Cardiovascular:  RRR, no MRG  Lungs: diminished to bases, no wheeze  Abdomen:  Obese, active bowel sounds  Musculoskeletal:  -edema  Skin:  Warm, dry, intact   LABS:  BMET Recent Labs  Lab 06/20/17 2230 06/20/17 2316 06/21/17 0209   NA 142 142 141  K 3.8 3.7 4.1  CL 90* 90* 92*  CO2 46*  --  39*  BUN 13 16 16   CREATININE 1.40* 1.30* 1.26*  GLUCOSE 189* 183* 171*    Electrolytes Recent Labs  Lab 06/20/17 2230 06/21/17 0209  CALCIUM 9.1 8.8*  MG  --  2.0  PHOS  --  2.2*    CBC Recent Labs  Lab 06/20/17 2230 06/20/17 2316 06/21/17 0209  WBC 9.1  --  9.4  HGB 9.0* 11.2* 8.2*  HCT 35.2* 33.0* 31.0*  PLT 302  --  232    Coag's Recent Labs  Lab 06/20/17 2230  APTT 31  INR 1.02    Sepsis Markers Recent Labs  Lab 06/20/17 2308 06/21/17 0133 06/21/17 0209  LATICACIDVEN  --  1.3  --   PROCALCITON <0.10  --  <0.10    ABG Recent Labs  Lab 06/20/17 2315 06/21/17 0118  PHART 7.361 7.394  PCO2ART 88.1* 73.9*  PO2ART 547.0* 81.0*    Liver Enzymes Recent Labs  Lab 06/20/17 2230  AST 20  ALT 12*  ALKPHOS 110  BILITOT 0.5  ALBUMIN 3.3*    Cardiac Enzymes Recent Labs  Lab 06/21/17 0250  TROPONINI 0.25*    Glucose Recent Labs  Lab 06/21/17 0003 06/21/17 0318 06/21/17 0549 06/21/17 0746  GLUCAP 180* 147* 102* 111*    Imaging Ct Head  Wo Contrast  Result Date: 06/21/2017 CLINICAL DATA:  Acute onset of altered level of consciousness. EXAM: CT HEAD WITHOUT CONTRAST TECHNIQUE: Contiguous axial images were obtained from the base of the skull through the vertex without intravenous contrast. COMPARISON:  CT of the head performed 05/07/2017 FINDINGS: Brain: No evidence of acute infarction, hemorrhage, hydrocephalus, extra-axial collection or mass lesion / mass effect. Prominence of the ventricles and sulci reflects mild to moderate cortical volume loss. Mild cerebellar atrophy is noted. A small chronic infarct is noted at the right posterior parietal lobe. Scattered periventricular and subcortical white matter change likely reflects small vessel ischemic microangiopathy. The brainstem and fourth ventricle are within normal limits. The basal ganglia are unremarkable in appearance. No  mass effect or midline shift is seen. Vascular: No hyperdense vessel or unexpected calcification. Skull: There is no evidence of fracture; visualized osseous structures are unremarkable in appearance. Sinuses/Orbits: Bilateral proptosis is noted. The orbits are otherwise grossly unremarkable. There is partial opacification of the left mastoid air cells. The paranasal sinuses and right mastoid air cells are well-aerated. Other: No significant soft tissue abnormalities are seen. IMPRESSION: 1. No acute intracranial pathology seen on CT. 2. Mild to moderate cortical volume loss and scattered small vessel ischemic microangiopathy. 3. 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 4. Bilateral proptosis noted. 5. Partial opacification of the left mastoid air cells. Electronically Signed   By: Garald Balding M.D.   On: 06/21/2017 06:43   Dg Chest Portable 1 View  Result Date: 06/20/2017 CLINICAL DATA:  ET tube placement EXAM: PORTABLE CHEST 1 VIEW COMPARISON:  05/08/2017 FINDINGS: Endotracheal tube is 5 cm above the carina. Cardiomegaly. Soft tissue prominence in the region of the right hilum and perihilar region. Increased markings in the lung bases. No visible effusions. IMPRESSION: Endotracheal tube 5 cm above the carina. Right perihilar soft tissue fullness, cannot exclude perihilar mass. This could be further evaluated with chest CT with IV contrast. Increased markings in the lung bases, likely chronic interstitial changes. Electronically Signed   By: Rolm Baptise M.D.   On: 06/20/2017 22:55     STUDIES:  CXR 5/18 >  Endotracheal tube is 5 cm above the carina. Cardiomegaly. Soft tissue prominence in the region of the right hilum and perihilar region. Increased markings in the lung bases. No visible effusions CT Head 5/19 >>  CULTURES: Blood 5/18 >> Sputum 5/18 >> U/A 5/18 > Negative  RVP 5/18 >>neg Strep P 5/19 >>   ANTIBIOTICS: None  SIGNIFICANT EVENTS: 5/18 > Presents to ED    LINES/TUBES: ETT 5/18 >>  DISCUSSION: 62 year old male with H/O COPD, HF, OSA presents to ED with AMS and acute on chronic hypercarbic/hypoxic respiratory failure requiring intubation.   ASSESSMENT / PLAN:  PULMONARY A: Acute on Chronic Hypoxic/Hypercarbic Respiratory Failure in setting of OSA (non-compliance with CPAP) vs decompensated HF (does not appear volume overloaded)  COPD without acute exacerbation  H/O Stage 1 NSCLC s/p Radiation (2016), OSA (not compliant with CPAP), on 2.5-3L [place] at baseline  P:   Vent support - 8cc/kg  F/u CXR  F/u ABG Wean sedation as below  BD's  Continue IV steroids for now although no sig bronchospasm, can likely taper rapidly VAP bundle   CARDIOVASCULAR A:  HTN H/O Diastolic Heart Failure (EF 55% 02/2017 with G1DD), CAD, HTN, HLD Hypotension - mild    BNP 289.9  P:  Cardiac Monitoring  Monitor BP - goal MAP >65 Trend Troponin  Continue ASA,  Plavix  Holding home lasix, atenolol   RENAL A:   Likely Chronic Renal Insuffiencey - baseline Scr ~1.4 LA 1.3  P:   Trend BMP  Replace electrolytes as indicated   GASTROINTESTINAL A:   H/O NASH, GERD Hyperammonemia - mild P:   NPO PPI Add lactulose given AMS   HEMATOLOGIC A:   Anemia - chronic. No s/s bleeding  P:  Trend CBC  Heparin for VTE  INFECTIOUS A:   No active issue.  P:   Trend WBC and Fever Curve Trend PCT RVP neg Monitor off Antibiotics at this time   ENDOCRINE A:   DM    P:   Trend Glucose  SSI  NEUROLOGIC A:   Acute Encephalopathy (presumed due to hypercarbia +/- narcotics +/- mild hyperammonemia).  Chronically hypercarbic so would not anticipate such sig AMS from his current PCO2 alone.  CT head neg acute issues  ETOH <10  H/O Chronic Pain  P:   RASS goal: 0/-1 Wean Fentanyl gtt to achieve RASS Daily WUA  PRN Versed  Lactulose  Trend ammonia  Consider EEG if no sig improvement  Holding home cymbalta, remeron, dilaudid  FAMILY  - Updates:  Family updated at bedside 5/19  - Inter-disciplinary family meet or Palliative Care meeting due by: 06/28/2017  Nickolas Madrid, NP 06/21/2017  8:48 AM Pager: [phone] or [phone]  Attending Note:  62 year old male with PMH of NASH and cirrhosis, COPD and stage 1 NSCLC presenting positive UDS for THC and opiates.  Patient presenting with lethargy and eventual respiratory failure and was intubated.  On exam, patient remains unresponsive with distant BS diffusely.  I reviewed CXR myself, ETT is in good position.  Discussed with PCCM-NP.  Will maintain on full vent support for now.  Minimize sedation.  Allow to wake up then begin weaning trials.  Discussed with PCCM-NP.  Start TF.  The patient is critically ill with multiple organ systems failure and requires high complexity decision making for assessment and support, frequent evaluation and titration of therapies, application of advanced monitoring technologies and extensive interpretation of multiple databases.   Critical Care Time devoted to patient care services described in this note is  36  Minutes. This time reflects time of care of this signee Dr Jennet Maduro. This critical care time does not reflect procedure time, or teaching time or supervisory time of PA/NP/Med student/Med Resident etc but could involve care discussion time.  Rush Farmer, M.D. [facility] Pulmonary/Critical Care Medicine. Pager: [phone]. After hours pager: [phone].

## 2017-06-21 NOTE — Progress Notes (Signed)
Patient transported to CT and back with no complication, ETT patent BBS confirmed. Vent plugged into a red outlet oxygen and medical air both plugged in.

## 2017-06-21 NOTE — ED Provider Notes (Signed)
Medical screening examination/treatment/procedure(s) were conducted as a shared visit with non-physician practitioner(s) and myself.  I personally evaluated the patient during the encounter.  EKG Interpretation  Date/Time:  Saturday Jun 20 2017 21:56:24 EDT Ventricular Rate:  88 PR Interval:    QRS Duration: 97 QT Interval:  366 QTC Calculation: 443 R Axis:   90 Text Interpretation:  Sinus rhythm Borderline right axis deviation Nonspecific repol abnormality, diffuse leads Interpretation limited secondary to artifact Confirmed by Fredia Sorrow [phone]) on 06/20/2017 9:58:40 PM   Patient seen by me along with the physician assistant.  Patient arrived by [facility] in respiratory distress altered mental status.  Patient's blood sugar was okay patient received 0.4 mg of Narcan woke up a little bit follow little bit of commands.  We initially tried him on some BiPAP however he was not really getting breaths and deeply.  Patient supposedly had been kind of out of it for 3 days.  Patient with admission recently requiring intubation for respiratory failure related to COPD.  Based on this patient needed intubation so we proceeded for intubation.  This was done by kaleidoscope by the physician assistant no difficulties with the intubation.  Patient received rocuronium and etomidate before hand.  Patient's oxygen sats remained stable blood pressure remained stable.  Following intubation chest x-ray was observed endotracheal tube was adjusted some.  Critical care was consulted they will admit the patient.  Critical care time documented by physician assistant.   Fredia Sorrow, MD 06/21/17 [phone]

## 2017-06-21 NOTE — Progress Notes (Addendum)
Dr Jimmey Ralph to bedside. Pt calm/resting/sleeping/arousable to voice to follow commands. Fentany bolus and rate increase done at md request. Pt wearing bilateral safety mittens, md request to also maintain bilateral soft wrist restraints. Informed md of multiple unsuccessful attempts to insert ogt and ngt by ED nursing and medical staff. Md states tube needs to be placed and request staff to make another attempt. Pt wife and son at bedside.

## 2017-06-21 NOTE — H&P (Signed)
PULMONARY / CRITICAL CARE MEDICINE   Name: Manuel Baxter MRN: [id number] DOB: 08/20/1955    ADMISSION DATE:  06/20/2017 CONSULTATION DATE:  06/20/2017  REFERRING MD:  Phylliss Bob, [place]   CHIEF COMPLAINT:  AMS  HISTORY OF PRESENT ILLNESS:   62 year old male with PMH of NASH, COPD (FEV1 23%), on 2.5-3L [place] at baseline, Stage 1 NSCLC s/p Radiation (2016), OSA (not compliant with CPAP), CAD, DM, HTN, HLD   Recent Admission 4/4-4/6 for Hypoxic/Hypercarbic Respiratory Failure requiring intubation   5/18 presents from home with AMS. Wife reports that patient has been increasingly lethargic throughout the day. For the last two days had "shaking" of extremities in which she reports he has had before with hypercarbia. No fever, chills, or sick contacts. Does report that patient will frequently not wear CPAP at HS. On arrival to ED patient was non-responsive requiring intubation. ABG 7.361/88.1/547. PCCM asked to admit.   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  PAST SURGICAL HISTORY: He  has no past surgical history on file.  Allergies  Allergen Reactions  . Doxepin Anaphylaxis  . Lisinopril Anaphylaxis  . Acetaminophen Other (See Comments)    Elevates liver enzymes,has hx of cirrhosis ( non-alcoholic )  . Contrast Media [Iodinated Diagnostic Agents] Other (See Comments)    Stopped breathing  . Gabapentin Other (See Comments)    Tremors, loopy  . Lyrica [Pregabalin] Other (See Comments)    nightmares  . Prozac [Fluoxetine Hcl] Other (See Comments)    Makes him mean    No current facility-administered medications on file prior to encounter.    Current Outpatient Medications on File Prior to Encounter  Medication Sig  . albuterol (PROAIR HFA) 108 (90 Base) MCG/ACT inhaler Inhale 2 puffs into the lungs every 6 (six) hours as needed for wheezing or  shortness of breath.  . Alum Hydroxide-Mag Carbonate (GAVISCON PO) Take 1 tablet by mouth daily as needed (indigestion/heartburn).  Marland Kitchen aspirin EC 81 MG tablet Take 81 mg by mouth daily.  . Aspirin-Caffeine (BC FAST PAIN RELIEF ARTHRITIS) 1000-65 MG PACK Take 2 packets by mouth every 4 (four) hours as needed (pain/headache).  Marland Kitchen atenolol (TENORMIN) 50 MG tablet Take 50 mg by mouth 2 (two) times daily.  . Cholecalciferol (VITAMIN D) 2000 units tablet Take 2,000 Units by mouth 2 (two) times daily.  . clopidogrel (PLAVIX) 75 MG tablet Take 75 mg by mouth daily with supper.  . cyanocobalamin 500 MCG tablet Take 500 mcg by mouth 2 (two) times daily. Vitamin B12  . DULoxetine (CYMBALTA) 30 MG capsule Take 30 mg by mouth daily.  . ferrous sulfate 325 (65 FE) MG tablet Take 325 mg by mouth 3 (three) times daily.  . fluticasone (FLONASE) 50 MCG/ACT nasal spray Place 1 spray into both nostrils daily.  . furosemide (LASIX) 40 MG tablet Take 1 tablet (40 mg total) by mouth daily.  Marland Kitchen glipiZIDE (GLUCOTROL) 5 MG tablet Take 2.5 mg by mouth 2 (two) times daily before a meal.  . HYDROmorphone (DILAUDID) 4 MG tablet Take 4 mg by mouth every 6 (six) hours as needed (chronic pain).  Marland Kitchen ipratropium-albuterol (DUONEB) 0.5-2.5 (3) MG/3ML SOLN Take 3 mLs by nebulization every 6 (six) hours.  Marland Kitchen loratadine (CLARITIN) 10 MG tablet Take 10 mg by mouth daily.  Marland Kitchen lubiprostone (AMITIZA) 8 MCG capsule Take 8 mcg by mouth  2 (two) times daily with a meal.  . Melatonin 3 MG TABS Take 3 mg by mouth at bedtime.  . mirtazapine (REMERON) 15 MG tablet Take 15 mg by mouth at bedtime.  . montelukast (SINGULAIR) 10 MG tablet Take 10 mg by mouth daily with supper.  . Omega-3 Fatty Acids (FISH OIL) 1000 MG CAPS Take 1,000-2,000 mg by mouth See admin instructions. Take 2 capsules (2000 mg) by mouth every morning and 1 capsule (1000 mg) before supper  . omeprazole (PRILOSEC) 20 MG capsule Take 20 mg by mouth daily.  Marland Kitchen OVER THE COUNTER  MEDICATION Place 0.5-1 mLs under the tongue See admin instructions. [employer] - place 1/2 dropperful (0.5 ml) under the tongue twice during the day, place 1 dropperful (1 ml) under the tongue at bedtime. 1 ml - 250 mg hemp, 405 mg linoleic acid, 135 mg Alpha Linoleic Acid, 90 m Oleic Acid.  . tiotropium (SPIRIVA) 18 MCG inhalation capsule Place 18 mcg into inhaler and inhale daily.     FAMILY HISTORY:  His indicated that the status of his other is unknown.   SOCIAL HISTORY: He  reports that he has been smoking cigarettes.  He has been smoking about 1.00 pack per day. He has never used smokeless tobacco. He reports that he does not drink alcohol or use drugs.  REVIEW OF SYSTEMS:   Unable to review as patient is encephalopathic   SUBJECTIVE:   VITAL SIGNS: BP 114/66   Pulse 74   Temp 97.6 F (36.4 C) (Oral)   Resp 17   Ht 5\' 6"  (1.676 m)   Wt 75.3 kg (166 lb 0.1 oz)   SpO2 100%   BMI 26.79 kg/m   HEMODYNAMICS:    VENTILATOR SETTINGS: Vent Mode: PRVC FiO2 (%):  [50 %-100 %] 50 % Set Rate:  [16 bmp-22 bmp] 22 bmp Vt Set:  [510 mL] 510 mL PEEP:  [5 cmH20] 5 cmH20 Plateau Pressure:  [30 cmH20] 30 cmH20  INTAKE / OUTPUT: No intake/output data recorded.  PHYSICAL EXAMINATION: General:  Obese adult male on vent  Neuro:  Opens eyes to physical stimulation, does not follow commands, pupils intact  HEENT:  ETT in place  Cardiovascular:  RRR, no MRG  Lungs: diminished to bases, no wheeze  Abdomen:  Obese, active bowel sounds  Musculoskeletal:  -edema  Skin:  Warm, dry, intact   LABS:  BMET Recent Labs  Lab 06/20/17 2230 06/20/17 2316  NA 142 142  K 3.8 3.7  CL 90* 90*  CO2 46*  --   BUN 13 16  CREATININE 1.40* 1.30*  GLUCOSE 189* 183*    Electrolytes Recent Labs  Lab 06/20/17 2230  CALCIUM 9.1    CBC Recent Labs  Lab 06/20/17 2230 06/20/17 2316  WBC 9.1  --   HGB 9.0* 11.2*  HCT 35.2* 33.0*  PLT 302  --     Coag's Recent Labs   Lab 06/20/17 2230  APTT 31  INR 1.02    Sepsis Markers Recent Labs  Lab 06/20/17 2308 06/21/17 0133  LATICACIDVEN  --  1.3  PROCALCITON <0.10  --     ABG Recent Labs  Lab 06/20/17 2315 06/21/17 0118  PHART 7.361 7.394  PCO2ART 88.1* 73.9*  PO2ART 547.0* 81.0*    Liver Enzymes Recent Labs  Lab 06/20/17 2230  AST 20  ALT 12*  ALKPHOS 110  BILITOT 0.5  ALBUMIN 3.3*    Cardiac Enzymes No results for input(s): TROPONINI, PROBNP in  the last 168 hours.  Glucose Recent Labs  Lab 06/21/17 0003  GLUCAP 180*    Imaging Dg Chest Portable 1 View  Result Date: 06/20/2017 CLINICAL DATA:  ET tube placement EXAM: PORTABLE CHEST 1 VIEW COMPARISON:  05/08/2017 FINDINGS: Endotracheal tube is 5 cm above the carina. Cardiomegaly. Soft tissue prominence in the region of the right hilum and perihilar region. Increased markings in the lung bases. No visible effusions. IMPRESSION: Endotracheal tube 5 cm above the carina. Right perihilar soft tissue fullness, cannot exclude perihilar mass. This could be further evaluated with chest CT with IV contrast. Increased markings in the lung bases, likely chronic interstitial changes. Electronically Signed   By: Rolm Baptise M.D.   On: 06/20/2017 22:55     STUDIES:  CXR 5/18 >  Endotracheal tube is 5 cm above the carina. Cardiomegaly. Soft tissue prominence in the region of the right hilum and perihilar region. Increased markings in the lung bases. No visible effusions CT Head 5/19 >>  CULTURES: Blood 5/18 >> Sputum 5/18 >> U/A 5/18 > Negative  RVP 5/18 >> Strep P 5/19 >>   ANTIBIOTICS: None  SIGNIFICANT EVENTS: 5/18 > Presents to ED   LINES/TUBES: ETT 5/18 >>  DISCUSSION: 62 year old male with H/O COPD, HF, OSA presents to ED with acute hypercarbic/hypoxic respiratory failure requiring intubation.   ASSESSMENT / PLAN:  PULMONARY A: Acute on Chronic Hypoxic/Hypercarbic Respiratory Failure in setting of OSA  (non-compliance with CPAP) vs decompensated HF (does not appear volume overloaded)  COPD (Not currently wheezing)  H/O Stage 1 NSCLC s/p Radiation (2016), OSA (not compliant with CPAP), on 2.5-3L [place] at baseline  P:   Vent Support > TV 8 cc/kg  Trend CXR/ABG  Increase Rate to 22  [place]/Pulmicort/Duoneb VAP Bundle   CARDIOVASCULAR A:  HTN H/O Diastolic Heart Failure (EF 55 02/2017 with G1DD), CAD, HTN, HLD   BNP 289.9  P:  Cardiac Monitoring  Maintain Systolic <244 Trend Troponin  Continue ASA, Plavix   RENAL A:   Acute vs Chronic Renal Insuffiencey (Last Documented Crt 1.4 now presents 1.3)  LA 1.3  P:   Trend BMP  Replace electrolytes as indicated   GASTROINTESTINAL A:   H/O NASH, GERD P:   NPO PPI Ammonia/LFT pending   HEMATOLOGIC A:   Anemia  P:  Trend CBC  Heparin for VTE  INFECTIOUS A:   Afebrile, WBC 9.1  U/A negative  P:   Trend WBC and Fever Curve PAN Culture  Trend PCT RVP pending   Monitor off Antibiotics at this time   ENDOCRINE A:   DM    P:   Trend Glucose  SSI  NEUROLOGIC A:   Acute Encephalopathy (presumed due to hypercarbia)  ETOH <10  H/O Chronic Pain  P:   RASS goal: 0/-1 Wean Fentanyl gtt to achieve RASS PRN Versed  Head CT pending    FAMILY  - Updates: Family updated at bedside   - Inter-disciplinary family meet or Palliative Care meeting due by: 06/28/2017    Hayden Pedro, AGACNP-BC [facility]  Pgr: [phone]  PCCM Pgr: [phone]

## 2017-06-21 NOTE — Progress Notes (Signed)
Pharmacy Antibiotic Note  Manuel Baxter is a 62 y.o. male admitted on 06/20/2017 with altered mental status. Now with concern for aspiration pneumonia.  Pharmacy has been consulted for Zosyn  Dosing.   WBC WNL and Tmax-  100.7. Scr- 1.26 and CrCl ~  60.3 ml/min.   Plan: Zosyn 3.375 gm every 8 hours (EI) Monitor renal function, cultures, and clinical improvement   Height: 5\' 6"  (167.6 cm) Weight: 175 lb 4.3 oz (79.5 kg) IBW/kg (Calculated) : 63.8  Temp (24hrs), Avg:99.1 F (37.3 C), Min:97.6 F (36.4 C), Max:100.7 F (38.2 C)  Recent Labs  Lab 06/20/17 2230 06/20/17 2316 06/21/17 0133 06/21/17 0209 06/21/17 0747 06/21/17 1009  WBC 9.1  --   --  9.4  --   --   CREATININE 1.40* 1.30*  --  1.26*  --   --   LATICACIDVEN  --   --  1.3  --  0.9 1.0    Estimated Creatinine Clearance: 60.3 mL/min (A) (by C-G formula based on SCr of 1.26 mg/dL (H)).    Allergies  Allergen Reactions  . Doxepin Anaphylaxis  . Lisinopril Anaphylaxis  . Acetaminophen Other (See Comments)    Elevates liver enzymes,has hx of cirrhosis ( non-alcoholic )  . Contrast Media [Iodinated Diagnostic Agents] Other (See Comments)    Stopped breathing  . Gabapentin Other (See Comments)    Tremors, loopy  . Lyrica [Pregabalin] Other (See Comments)    nightmares  . Prozac [Fluoxetine Hcl] Other (See Comments)    Makes him mean    Antimicrobials this admission: 5/19 Zosyn >>    Thank you for allowing pharmacy to be a part of this patient's care.  Jimmy Footman, PharmD, BCPS PGY2 Infectious Diseases Pharmacy Resident Pager: [phone]  06/21/2017 8:06 PM

## 2017-06-21 NOTE — Progress Notes (Signed)
Pt transported to 3G99 without complications

## 2017-06-21 NOTE — Progress Notes (Signed)
Temp 100.7 today, and Sputum gram stain showing abundant GPR's and GPR's. Will start Zosyn for possible aspiration pna. Follow-up cultures.

## 2017-06-22 ENCOUNTER — Encounter (HOSPITAL_COMMUNITY): Payer: Self-pay

## 2017-06-22 ENCOUNTER — Inpatient Hospital Stay (HOSPITAL_COMMUNITY): Payer: Medicare Other

## 2017-06-22 ENCOUNTER — Other Ambulatory Visit: Payer: Self-pay

## 2017-06-22 DIAGNOSIS — J9622 Acute and chronic respiratory failure with hypercapnia: Secondary | ICD-10-CM

## 2017-06-22 DIAGNOSIS — G9340 Encephalopathy, unspecified: Secondary | ICD-10-CM

## 2017-06-22 DIAGNOSIS — I959 Hypotension, unspecified: Secondary | ICD-10-CM

## 2017-06-22 LAB — GLUCOSE, CAPILLARY
GLUCOSE-CAPILLARY: 175 mg/dL — AB (ref 65–99)
GLUCOSE-CAPILLARY: 161 mg/dL — AB (ref 65–99)
GLUCOSE-CAPILLARY: 143 mg/dL — AB (ref 65–99)
Glucose-Capillary: 171 mg/dL — ABNORMAL HIGH (ref 65–99)
Glucose-Capillary: 182 mg/dL — ABNORMAL HIGH (ref 65–99)
Glucose-Capillary: 149 mg/dL — ABNORMAL HIGH (ref 65–99)
Glucose-Capillary: 162 mg/dL — ABNORMAL HIGH (ref 65–99)

## 2017-06-22 LAB — CBC
HCT: 27.9 % — ABNORMAL LOW (ref 39.0–52.0)
Hemoglobin: 7.5 g/dL — ABNORMAL LOW (ref 13.0–17.0)
MCH: 22.7 pg — AB (ref 26.0–34.0)
MCHC: 26.9 g/dL — ABNORMAL LOW (ref 30.0–36.0)
MCV: 84.3 fL (ref 78.0–100.0)
PLATELETS: 199 10*3/uL (ref 150–400)
RBC: 3.31 MIL/uL — AB (ref 4.22–5.81)
RDW: 16.1 % — AB (ref 11.5–15.5)
WBC: 7.4 10*3/uL (ref 4.0–10.5)

## 2017-06-22 LAB — BASIC METABOLIC PANEL
Anion gap: 11 (ref 5–15)
BUN: 27 mg/dL — AB (ref 6–20)
CALCIUM: 8.5 mg/dL — AB (ref 8.9–10.3)
CO2: 32 mmol/L (ref 22–32)
CREATININE: 1.09 mg/dL (ref 0.61–1.24)
Chloride: 101 mmol/L (ref 101–111)
GFR calc non Af Amer: >60 mL/min (ref >60)
Glucose, Bld: 183 mg/dL — ABNORMAL HIGH (ref 65–99)
Potassium: 3.1 mmol/L — ABNORMAL LOW (ref 3.5–5.1)
SODIUM: 144 mmol/L (ref 135–145)

## 2017-06-22 LAB — URINE CULTURE: Culture: NO GROWTH

## 2017-06-22 LAB — OCCULT BLOOD X 1 CARD TO LAB, STOOL: Fecal Occult Bld: NEGATIVE

## 2017-06-22 LAB — MRSA CULTURE: CULTURE: NOT DETECTED

## 2017-06-22 LAB — PROCALCITONIN: Procalcitonin: <0.1 ng/mL

## 2017-06-22 MED ORDER — PANTOPRAZOLE SODIUM 40 MG PO PACK
40.0000 mg | PACK | Freq: Every day | ORAL | Status: DC
Start: 2017-06-23 — End: 2017-06-22

## 2017-06-22 MED ORDER — DOCUSATE SODIUM 100 MG PO CAPS
100.0000 mg | ORAL_CAPSULE | Freq: Every day | ORAL | Status: DC
  Administered 2017-06-23 – 2017-06-24 (×2): 100 mg via ORAL
  Filled 2017-06-22 (×2): qty 1

## 2017-06-22 MED ORDER — SIMETHICONE 80 MG PO CHEW
80.0000 mg | CHEWABLE_TABLET | Freq: Once | ORAL | Status: AC
  Administered 2017-06-22: 80 mg via ORAL
  Filled 2017-06-22: qty 1

## 2017-06-22 MED ORDER — ORAL CARE MOUTH RINSE
15.0000 mL | Freq: Two times a day (BID) | OROMUCOSAL | Status: DC
  Administered 2017-06-22 – 2017-06-24 (×4): 15 mL via OROMUCOSAL

## 2017-06-22 MED ORDER — LACTULOSE 10 GM/15ML PO SOLN
20.0000 g | Freq: Two times a day (BID) | ORAL | Status: DC
  Administered 2017-06-23 – 2017-06-24 (×3): 20 g via ORAL
  Filled 2017-06-22 (×3): qty 30

## 2017-06-22 MED ORDER — HYDROMORPHONE HCL 2 MG PO TABS
2.0000 mg | ORAL_TABLET | ORAL | Status: DC | PRN
  Administered 2017-06-22 – 2017-06-23 (×3): 2 mg via ORAL
  Filled 2017-06-22 (×3): qty 1

## 2017-06-22 MED ORDER — POTASSIUM CHLORIDE 20 MEQ/15ML (10%) PO SOLN
30.0000 meq | ORAL | Status: AC
  Administered 2017-06-22 (×2): 30 meq
  Filled 2017-06-22 (×2): qty 30

## 2017-06-22 MED ORDER — POLYETHYLENE GLYCOL 3350 17 G PO PACK
17.0000 g | PACK | Freq: Every day | ORAL | Status: DC
  Administered 2017-06-24: 17 g via ORAL
  Filled 2017-06-22: qty 1

## 2017-06-22 MED ORDER — METHYLPREDNISOLONE SODIUM SUCC 40 MG IJ SOLR
40.0000 mg | Freq: Two times a day (BID) | INTRAMUSCULAR | Status: DC
  Administered 2017-06-22 – 2017-06-24 (×4): 40 mg via INTRAVENOUS
  Filled 2017-06-22 (×3): qty 1

## 2017-06-22 MED ORDER — DOCUSATE SODIUM 50 MG/5ML PO LIQD
100.0000 mg | Freq: Two times a day (BID) | ORAL | Status: DC
  Administered 2017-06-22: 100 mg
  Filled 2017-06-22: qty 10

## 2017-06-22 MED ORDER — PANTOPRAZOLE SODIUM 40 MG PO TBEC: 40.0000 mg | DELAYED_RELEASE_TABLET | Freq: Every day | ORAL | Status: DC

## 2017-06-22 MED ORDER — ALBUTEROL SULFATE (2.5 MG/3ML) 0.083% IN NEBU
2.5000 mg | INHALATION_SOLUTION | RESPIRATORY_TRACT | Status: DC | PRN
  Administered 2017-06-22: 2.5 mg via RESPIRATORY_TRACT
  Filled 2017-06-22: qty 3

## 2017-06-22 MED ORDER — POLYETHYLENE GLYCOL 3350 17 G PO PACK
17.0000 g | PACK | Freq: Every day | ORAL | Status: DC
  Administered 2017-06-22: 17 g
  Filled 2017-06-22: qty 1

## 2017-06-22 MED ORDER — ASPIRIN 300 MG RE SUPP
600.0000 mg | Freq: Four times a day (QID) | RECTAL | Status: DC | PRN
  Administered 2017-06-22: 600 mg via RECTAL
  Filled 2017-06-22: qty 2

## 2017-06-22 NOTE — Progress Notes (Signed)
[facility] Progress Note Patient Name: Manuel Baxter DOB: 1955/10/06 MRN: [id number]   Date of Service  06/22/2017  HPI/Events of Note  Headache - unable to take PO.  eICU Interventions  Will order: 1. ASA Suppository 600 mg PR Q 6 hours PRN headache.         Sommer,Steven Eugene 06/22/2017, 9:08 PM

## 2017-06-22 NOTE — Progress Notes (Signed)
This RN attempted a BSE with this patient @ 1600 hrs today. Patient p/w audible gurgling and coughing following attempts to swallow applesauce, ice chips, and thin water. The BSE was terminated and Dr. Lynetta Mare was notified. Patient is to remain NPO until a SLP eval.

## 2017-06-22 NOTE — Progress Notes (Signed)
..  [facility] ADULT ICU REPLACEMENT PROTOCOL FOR AM LAB REPLACEMENT ONLY  The patient does apply for the [facility] Adult ICU Electrolyte Replacment Protocol based on the criteria listed below:   1. Is GFR >/= 40 ml/min? Yes.    Patient's GFR today is >60 2. Is urine output >/= 0.5 ml/kg/hr for the last 6 hours? Yes.   Patient's UOP is 0.70 ml/kg/hr 3. Is BUN < 60 mg/dL? Yes.    Patient's BUN today is 27 4. Abnormal electrolyte(s): K+ 3.15. Ordered repletion with: protocol 6. If a panic level lab has been reported, has the CCM MD in charge been notified? No..   Physician:  Dr.Sommer  Carlisle Beers 06/22/2017 6:51 AM

## 2017-06-22 NOTE — Progress Notes (Signed)
60 mL of Fentanyl wasted in the sink. Witnessed by Delorise Jackson, RN.

## 2017-06-22 NOTE — Progress Notes (Signed)
Had visit with patient. Had a lot on his mind.  Offered pastoral presence and listening.  Had prayer with patient for peace of mind and for staff and family.  Conard Novak, Chaplain   06/22/17 1600  Clinical Encounter Type  Visited With Patient  Visit Type Initial;Spiritual support;Social support  Referral From Patient  Consult/Referral To Chaplain  Spiritual Encounters  Spiritual Needs Prayer;Emotional;Other (Comment) (pastoral presence)

## 2017-06-22 NOTE — Plan of Care (Signed)
AA+Ox4. Extubated today to [place]. Foley removed. PIVs only. Continues to receive IV antibiotics.

## 2017-06-22 NOTE — Procedures (Signed)
Extubation Procedure Note  Patient Details:   Name: Manuel Baxter DOB: 08/17/1955 MRN: [id number]   Airway Documentation:    Vent end date: 06/22/17 Vent end time: 1052   Evaluation  O2 sats: stable throughout Complications: No apparent complications Patient did tolerate procedure well. Bilateral Breath Sounds: Rhonchi   Yes, Placed on 4L [place]  SPO2 100%  Gonzella Lex 06/22/2017, 10:59 AM

## 2017-06-22 NOTE — Progress Notes (Signed)
Patient states that he is not going to use his flutter valve. Pt stated that "it does not work". Patient states that he will use his IS. Pt is stable at this time taking his scheduled neb. No distress or complications noted.

## 2017-06-22 NOTE — Progress Notes (Signed)
PCCM INTERVAL PROGRESS NOTE  PM rounding.   Some mild respiratory distress after transfer to bed. Wet cough, unable to clear sputum. Complaining of gas pain. Has had bowel movement since this AM  Pulmonary secretions: will order chest PT via bed and flutter valve.  COPD with exacerbation: Will ask RT for PRN neb now.  Gas: PO chewable simethicone. NPO otherwise. SLP will eval swallowing tomorrow.    Will leave in ICU overnight. Looks good overall, but at risk for decompensation. Would like to be re-intubated should he need it. Short term only.   Georgann Housekeeper, AGACNP-BC [facility] Pulmonology/Critical Care Pager [phone] or [phone]  06/22/2017 5:07 PM

## 2017-06-22 NOTE — Care Management Note (Signed)
Case Management Note  Patient Details  Name: Manuel Baxter MRN: [id number] Date of Birth: 1955/03/01  Subjective/Objective:   Admitted for Acute on chronic respiratory failure.          Action/Plan: Spoke with Mickel Baas at [facility], patient PCP is Dr. Heywood Iles, Social Worker for discharge planning needs is Georgana Curio,  Phone: [phone], ext [phone].  Prior to admission patient active for Oxygen 2l through the [place].  NCM will continue to follow how patient progresses.  Expected Discharge Date:   To Be Determined               Expected Discharge Plan:   To Be Determined  In-House Referral:  Clinical Social Work  Discharge planning Services  CM Consult  Status of Service:  In process, will continue to follow  Kristen Cardinal, RN 06/22/2017, 3:55 PM

## 2017-06-22 NOTE — Progress Notes (Signed)
PULMONARY / CRITICAL CARE MEDICINE   Name: Manuel Baxter MRN: [id number] DOB: 25-Jun-1955    ADMISSION DATE:  06/20/2017 CONSULTATION DATE:  06/20/2017  REFERRING MD:  Phylliss Bob, [place]   CHIEF COMPLAINT:  AMS  HISTORY OF PRESENT ILLNESS:   62 year old male with PMH of NASH, COPD (FEV1 23%), on 2.5-3L [place] at baseline, Stage 1 NSCLC s/p Radiation (2016), OSA (not compliant with CPAP), CAD, DM, HTN, HLD   Recent Admission 4/4-4/6 for Hypoxic/Hypercarbic Respiratory Failure requiring intubation   5/18 presents from home with AMS. Wife reports that patient has been increasingly lethargic throughout the day. For the last two days had "shaking" of extremities in which she reports he has had before with hypercarbia. No fever, chills, or sick contacts. Does report that patient will frequently not wear CPAP at HS. On arrival to ED patient was non-responsive requiring intubation. ABG 7.361/88.1/547. PCCM asked to admit.   SUBJECTIVE:  Awake and following commands this morning. Tolerating PS wean.   VITAL SIGNS: BP 131/74 (BP Location: Right Arm)   Pulse 70   Temp 99.4 F (37.4 C) (Oral)   Resp 20   Ht 5\' 6"  (1.676 m)   Wt 80.5 kg (177 lb 7.5 oz)   SpO2 98%   BMI 28.64 kg/m   HEMODYNAMICS:    VENTILATOR SETTINGS: Vent Mode: PSV;CPAP FiO2 (%):  [35 %-50 %] 40 % Set Rate:  [22 bmp] 22 bmp Vt Set:  [510 mL] 510 mL PEEP:  [5 cmH20] 5 cmH20 Pressure Support:  [5 cmH20] 5 cmH20 Plateau Pressure:  [14 cmH20-22 cmH20] 18 cmH20  INTAKE / OUTPUT: I/O last 3 completed shifts: In: 3356.6 [I.V.:2256.6; IV Piggyback:1100] Out: 650 [Urine:650]  PHYSICAL EXAMINATION:  General:  Obese adult male on vent. Neuro:  Alert, oriented, non-focal. Writing answers on paper.  HEENT:  ETT in place.  Cardiovascular:  RRR, no MRG.  Lungs: Coarse bibasilar wheeze.  Abdomen:  Obese, active bowel sounds.  Musculoskeletal:  No acute deformity. No edema. Skin:  Warm, dry, intact.   LABS:  BMET Recent Labs   Lab 06/20/17 2230 06/20/17 2316 06/21/17 0209 06/22/17 0426  NA 142 142 141 144  K 3.8 3.7 4.1 3.1*  CL 90* 90* 92* 101  CO2 46*  --  39* 32  BUN 13 16 16  27*  CREATININE 1.40* 1.30* 1.26* 1.09  GLUCOSE 189* 183* 171* 183*    Electrolytes Recent Labs  Lab 06/20/17 2230 06/21/17 0209 06/22/17 0426  CALCIUM 9.1 8.8* 8.5*  MG  --  2.0  --   PHOS  --  2.2*  --     CBC Recent Labs  Lab 06/20/17 2230 06/20/17 2316 06/21/17 0209 06/22/17 0426  WBC 9.1  --  9.4 7.4  HGB 9.0* 11.2* 8.2* 7.5*  HCT 35.2* 33.0* 31.0* 27.9*  PLT 302  --  232 199    Coag's Recent Labs  Lab 06/20/17 2230  APTT 31  INR 1.02    Sepsis Markers Recent Labs  Lab 06/20/17 2308 06/21/17 0133 06/21/17 0209 06/21/17 0747 06/21/17 1009 06/22/17 0426  LATICACIDVEN  --  1.3  --  0.9 1.0  --   PROCALCITON <0.10  --  <0.10  --   --  <0.10    ABG Recent Labs  Lab 06/20/17 2315 06/21/17 0118 06/21/17 1100  PHART 7.361 7.394 7.435  PCO2ART 88.1* 73.9* 56.8*  PO2ART 547.0* 81.0* 59.4*    Liver Enzymes Recent Labs  Lab 06/20/17 2230  AST 20  ALT 12*  ALKPHOS 110  BILITOT 0.5  ALBUMIN 3.3*    Cardiac Enzymes Recent Labs  Lab 06/21/17 0250 06/21/17 0734 06/21/17 1439  TROPONINI 0.25* 0.70* 0.33*    Glucose Recent Labs  Lab 06/21/17 1117 06/21/17 1503 06/21/17 2041 06/21/17 2324 06/22/17 0332 06/22/17 0736  GLUCAP 185* 184* 192* 197* 175* 171*    Imaging Dg Abd 1 View  Result Date: 06/21/2017 CLINICAL DATA:  OG tube placed EXAM: ABDOMEN - 1 VIEW COMPARISON:  None FINDINGS: Placement of nasogastric tube with tip in the gastric body. Side port at the GE junction. Basilar atelectasis. IMPRESSION: NG tube in stomach with side port GE junction Electronically Signed   By: Suzy Bouchard M.D.   On: 06/21/2017 11:49   Dg Chest Portable 1 View  Result Date: 06/22/2017 CLINICAL DATA:  Respiratory failure. EXAM: PORTABLE CHEST 1 VIEW COMPARISON:  One-view chest x-ray  06/20/2017 FINDINGS: Endotracheal tube is stable. NG tube has been placed. This courses off the inferior border the film. Bilateral lower lobe airspace opacities are similar to the prior exam. Changes of COPD are noted. IMPRESSION: 1. Endotracheal tube is stable. 2. Persistent bilateral lower lobe airspace opacities. Infection superimposed on chronic interstitial coarsening is not excluded. Electronically Signed   By: San Morelle M.D.   On: 06/22/2017 07:23     STUDIES:  CXR 5/18 >  Endotracheal tube is 5 cm above the carina. Cardiomegaly. Soft tissue prominence in the region of the right hilum and perihilar region. Increased markings in the lung bases. No visible effusions CT Head 5/19 >> No acute intracranial pathology seen on CT. Mild to moderate cortical volume loss and scattered small vessel ischemic microangiopathy. 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 Bilateral proptosis noted. Partial opacification of the left mastoid air cells.  CULTURES: Blood 5/18 >> Sputum 5/18 >> U/A 5/18 > Negative  RVP 5/18 >>neg Strep P 5/19 >>   ANTIBIOTICS: Zosyn 5/29 >  SIGNIFICANT EVENTS: 5/18 > Presents to ED   LINES/TUBES: ETT 5/18 >>  DISCUSSION: 62 year old male with H/O COPD, HF, OSA presents to ED with AMS and acute on chronic hypercarbic/hypoxic respiratory failure requiring intubation.   ASSESSMENT / PLAN:  PULMONARY A: Acute on Chronic Hypoxic/Hypercarbic Respiratory Failure in setting of OSA (non-compliance with CPAP)  COPD without acute exacerbation  OSA (not compliant with CPAP), on 2.5-3L [place] at home H/O Stage 1 NSCLC s/p Radiation (2016).  P:   Wean to extubate today hopefully Continue nebulized bronchodilators Continue nebulized budesonide Wean IV steroids to 40mg  q 12 hours. VAP bundle    CARDIOVASCULAR A:  HTN H/O Diastolic Heart Failure (EF 55% 02/2017 with G1DD), CAD, HTN, HLD Hypotension - mild   (improved) P:  Cardiac Monitoring   Continue ASA, Plavix  Holding home lasix, atenolol   RENAL A:   Likely Chronic Renal Insuffiencey - baseline Scr ~1.4 Hypokalemia P:   Trend BMP  Replace electrolytes as indicated  Gentle hydration  GASTROINTESTINAL A:   H/O NASH, GERD Hyperammonemia - mild P:   NPO - if not extubated today will start TF PPI Continue lactulose for now.    HEMATOLOGIC A:   Anemia - chronic. Now Hgb down to 7.2. No clear evidence bleeding.  P:  Trend CBC  Heparin sq for VTE prophylaxis.  Occult blood GI testing.  INFECTIOUS A:   Fever respioratory culture with abundance GPC. Mod GNR. MRSA swab negative. PCT negative.  P:   Trend WBC and Fever  Curve Trend PCT Zosyn started 5/19. Continue for now. Watch cultures. Low threshold to discontinue  ENDOCRINE A:   DM    P:   Trend Glucose  SSI  NEUROLOGIC A:   Acute Encephalopathy (presumed due to hypercarbia +/- narcotics +/- mild hyperammonemia).  Did have some response to Narcan in ED, was not tried on infusion. CT head neg acute issues  H/O Chronic Pain  On dilaudid. Takes about q7 hours. P:   RASS goal: 0/-1 Wean Fentanyl gtt to achieve RASS WUA now PRN Versed  Lactulose  Trend ammonia  Holding home cymbalta, remeron, dilaudid  FAMILY  - Updates: Family updated at bedside 5/20  - Inter-disciplinary family meet or Palliative Care meeting due by: 06/28/2017  Georgann Housekeeper, AGACNP-BC [place] Pulmonology/Critical Care Pager [phone] or [phone]  06/22/2017 9:44 AM

## 2017-06-23 ENCOUNTER — Inpatient Hospital Stay (HOSPITAL_COMMUNITY): Payer: Medicare Other

## 2017-06-23 DIAGNOSIS — J9621 Acute and chronic respiratory failure with hypoxia: Principal | ICD-10-CM

## 2017-06-23 LAB — COMPREHENSIVE METABOLIC PANEL
ALBUMIN: 2.7 g/dL — AB (ref 3.5–5.0)
ALT: 15 U/L — ABNORMAL LOW (ref 17–63)
ANION GAP: 8 (ref 5–15)
AST: 26 U/L (ref 15–41)
Alkaline Phosphatase: 86 U/L (ref 38–126)
BILIRUBIN TOTAL: 0.7 mg/dL (ref 0.3–1.2)
BUN: 28 mg/dL — ABNORMAL HIGH (ref 6–20)
CO2: 36 mmol/L — AB (ref 22–32)
Calcium: 8.9 mg/dL (ref 8.9–10.3)
Chloride: 103 mmol/L (ref 101–111)
Creatinine, Ser: 1.11 mg/dL (ref 0.61–1.24)
GFR calc Af Amer: >60 mL/min (ref >60)
GFR calc non Af Amer: >60 mL/min (ref >60)
GLUCOSE: 140 mg/dL — AB (ref 65–99)
POTASSIUM: 3.8 mmol/L (ref 3.5–5.1)
Sodium: 147 mmol/L — ABNORMAL HIGH (ref 135–145)
TOTAL PROTEIN: 6.1 g/dL — AB (ref 6.5–8.1)

## 2017-06-23 LAB — GLUCOSE, CAPILLARY
GLUCOSE-CAPILLARY: 139 mg/dL — AB (ref 65–99)
GLUCOSE-CAPILLARY: 112 mg/dL — AB (ref 65–99)
Glucose-Capillary: 112 mg/dL — ABNORMAL HIGH (ref 65–99)
Glucose-Capillary: 162 mg/dL — ABNORMAL HIGH (ref 65–99)
Glucose-Capillary: 214 mg/dL — ABNORMAL HIGH (ref 65–99)
Glucose-Capillary: 230 mg/dL — ABNORMAL HIGH (ref 65–99)

## 2017-06-23 LAB — CULTURE, RESPIRATORY: CULTURE: NORMAL

## 2017-06-23 LAB — CBC
HEMATOCRIT: 27.8 % — AB (ref 39.0–52.0)
HEMOGLOBIN: 7.3 g/dL — AB (ref 13.0–17.0)
MCH: 22.5 pg — ABNORMAL LOW (ref 26.0–34.0)
MCHC: 26.3 g/dL — ABNORMAL LOW (ref 30.0–36.0)
MCV: 85.8 fL (ref 78.0–100.0)
Platelets: 194 10*3/uL (ref 150–400)
RBC: 3.24 MIL/uL — ABNORMAL LOW (ref 4.22–5.81)
RDW: 16.3 % — AB (ref 11.5–15.5)
WBC: 8.4 10*3/uL (ref 4.0–10.5)

## 2017-06-23 LAB — CULTURE, RESPIRATORY W GRAM STAIN

## 2017-06-23 LAB — AMMONIA: AMMONIA: 33 umol/L (ref 9–35)

## 2017-06-23 LAB — PHOSPHORUS: Phosphorus: 3.3 mg/dL (ref 2.5–4.6)

## 2017-06-23 LAB — MAGNESIUM: Magnesium: 2.2 mg/dL (ref 1.7–2.4)

## 2017-06-23 MED ORDER — HYDROMORPHONE HCL 2 MG PO TABS
4.0000 mg | ORAL_TABLET | Freq: Four times a day (QID) | ORAL | Status: DC | PRN
  Administered 2017-06-23 – 2017-06-24 (×3): 4 mg via ORAL
  Filled 2017-06-23 (×3): qty 2

## 2017-06-23 MED ORDER — DULOXETINE HCL 30 MG PO CPEP
30.0000 mg | ORAL_CAPSULE | Freq: Every day | ORAL | Status: DC
  Administered 2017-06-23 – 2017-06-25 (×3): 30 mg via ORAL
  Filled 2017-06-23 (×3): qty 1

## 2017-06-23 MED ORDER — PANTOPRAZOLE SODIUM 40 MG PO TBEC
40.0000 mg | DELAYED_RELEASE_TABLET | Freq: Every day | ORAL | Status: DC
  Administered 2017-06-23 – 2017-06-25 (×3): 40 mg via ORAL
  Filled 2017-06-23 (×3): qty 1

## 2017-06-23 MED ORDER — ATENOLOL 25 MG PO TABS
50.0000 mg | ORAL_TABLET | Freq: Every day | ORAL | Status: DC
  Administered 2017-06-23 – 2017-06-25 (×3): 50 mg via ORAL
  Filled 2017-06-23 (×2): qty 2
  Filled 2017-06-23: qty 1

## 2017-06-23 MED ORDER — PANTOPRAZOLE SODIUM 40 MG PO PACK: 40.0000 mg | PACK | Freq: Every day | ORAL | Status: DC

## 2017-06-23 MED ORDER — TIOTROPIUM BROMIDE MONOHYDRATE 18 MCG IN CAPS
18.0000 ug | ORAL_CAPSULE | Freq: Every day | RESPIRATORY_TRACT | Status: DC
  Administered 2017-06-23 – 2017-06-25 (×3): 18 ug via RESPIRATORY_TRACT
  Filled 2017-06-23: qty 5

## 2017-06-23 MED ORDER — ASPIRIN 325 MG PO TABS: 650.0000 mg | ORAL_TABLET | Freq: Four times a day (QID) | ORAL | Status: DC | PRN

## 2017-06-23 MED ORDER — VITAMIN B-12 1000 MCG PO TABS
500.0000 ug | ORAL_TABLET | Freq: Two times a day (BID) | ORAL | Status: DC
  Administered 2017-06-23 – 2017-06-25 (×5): 500 ug via ORAL
  Filled 2017-06-23 (×5): qty 1

## 2017-06-23 MED ORDER — MIRTAZAPINE 15 MG PO TABS
15.0000 mg | ORAL_TABLET | Freq: Every day | ORAL | Status: DC
  Administered 2017-06-23 – 2017-06-24 (×2): 15 mg via ORAL
  Filled 2017-06-23 (×2): qty 1

## 2017-06-23 MED ORDER — LORATADINE 10 MG PO TABS
10.0000 mg | ORAL_TABLET | Freq: Every day | ORAL | Status: DC
  Administered 2017-06-23 – 2017-06-25 (×3): 10 mg via ORAL
  Filled 2017-06-23 (×3): qty 1

## 2017-06-23 MED ORDER — FLUTICASONE PROPIONATE 50 MCG/ACT NA SUSP
1.0000 | Freq: Every day | NASAL | Status: DC
  Administered 2017-06-23 – 2017-06-24 (×2): 1 via NASAL
  Filled 2017-06-23: qty 16

## 2017-06-23 MED ORDER — FUROSEMIDE 40 MG PO TABS
40.0000 mg | ORAL_TABLET | Freq: Every day | ORAL | Status: DC
  Administered 2017-06-23 – 2017-06-25 (×3): 40 mg via ORAL
  Filled 2017-06-23 (×3): qty 1

## 2017-06-23 MED ORDER — FERROUS SULFATE 325 (65 FE) MG PO TABS
325.0000 mg | ORAL_TABLET | Freq: Three times a day (TID) | ORAL | Status: DC
  Administered 2017-06-23 – 2017-06-25 (×7): 325 mg via ORAL
  Filled 2017-06-23 (×7): qty 1

## 2017-06-23 MED ORDER — MONTELUKAST SODIUM 10 MG PO TABS
10.0000 mg | ORAL_TABLET | Freq: Every day | ORAL | Status: DC
  Administered 2017-06-23 – 2017-06-24 (×2): 10 mg via ORAL
  Filled 2017-06-23 (×2): qty 1

## 2017-06-23 NOTE — Progress Notes (Signed)
Pt is sleeping comfortably at this time. All CPAP machines are currently in use throughout the hospital. Once one become available, will see if patient still has the desire to wear it. Pt is stable. Breathing Rate and Pattern normal and SATs are stable. RN aware

## 2017-06-23 NOTE — Progress Notes (Signed)
Modified Barium Swallow Progress Note  Patient Details  Name: Manuel Baxter MRN: [id number] Date of Birth: 11-Mar-1955  Today's Date: 06/23/2017  Modified Barium Swallow completed.  Full report located under Chart Review in the Imaging Section.  Brief recommendations include the following:  Clinical Impression  Pt's oropharyngeal swallow is WFL. He does have fairly consistent coughing that follows swallowing, but this was not related to airway compromise. Brief scan of the esophagus was unremarkable. Recommend to initiate regular textures and thin liquids. SLP will f/u briefly for tolerance given recent intubation to ensure tolerance.   Swallow Evaluation Recommendations       SLP Diet Recommendations: Regular solids;Thin liquid   Liquid Administration via: Cup;Straw   Medication Administration: Whole meds with liquid   Supervision: Patient able to self feed;Intermittent supervision to cue for compensatory strategies   Compensations: Slow rate;Small sips/bites   Postural Changes: Seated upright at 90 degrees   Oral Care Recommendations: Oral care BID        Germain Osgood 06/23/2017,2:23 PM   Germain Osgood, M.A. CCC-SLP [phone]

## 2017-06-23 NOTE — Progress Notes (Signed)
Pt transferred to 2W12, wife aware. All belongings with pt. Very congested IS encouraged, MD paged to asked about tele vs med surg orders. Changed to tele to be able to keep patient on cardiac monitor.

## 2017-06-23 NOTE — Evaluation (Signed)
Clinical/Bedside Swallow Evaluation Patient Details  Name: Manuel Baxter MRN: [id number] Date of Birth: Jun 06, 1955  Today's Date: 06/23/2017 Time: SLP Start Time (ACUTE ONLY): 2130 SLP Stop Time (ACUTE ONLY): 0904 SLP Time Calculation (min) (ACUTE ONLY): 30 min  Past Medical History:  Past Medical History:  Diagnosis Date  . 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 COPD (chronic obstructive pulmonary disease) ([place])   . Lung cancer [facility])    Past Surgical History: History reviewed. No pertinent surgical history. HPI:  Pt is a 62 year old maleadmitted with AMS and acute on chronic respiratory failure requiring ETT 5/18-5/20. Pt attempted POs with RN s/p extubation wtih coughing, gurgling noted. He was made NPO pending SLP evaluation. PMH of NASH, COPD (FEV1 23%), on 2.5-3L [place] at baseline, Stage 1 NSCLC s/p Radiation (2016), OSA (not compliant with CPAP), CAD, DM, HTN, HLD. Recent admission 4/4-4/6 for hypoxic/hypercarbic respiratory failure requiring intubation.    Assessment / Plan / Recommendation Clinical Impression  Pt has clear phonation and his volitional cough is productive. He has a mild, intermittent baseline cough that appears to increase in frequency and intensity specifically while consuming thin liquids. He appears to do well initially with purees, although as trials continue he starts coughing more with these textures too. Recommend he be NPO except for meds in puree and  intermittent ice chips pending completion of MBS - planned with radiology for today. SLP Visit Diagnosis: Dysphagia, unspecified (R13.10)    Aspiration Risk  Moderate aspiration risk    Diet Recommendation NPO except meds;Ice chips PRN after oral care   Medication Administration: Crushed with puree    Other  Recommendations Oral Care Recommendations: Oral care QID Other Recommendations: Have oral suction available   Follow up Recommendations        Frequency and Duration             Prognosis Prognosis for Safe Diet Advancement: Good      Swallow Study   General HPI: Pt is a 62 year old maleadmitted with AMS and acute on chronic respiratory failure requiring ETT 5/18-5/20. Pt attempted POs with RN s/p extubation wtih coughing, gurgling noted. He was made NPO pending SLP evaluation. PMH of NASH, COPD (FEV1 23%), on 2.5-3L [place] at baseline, Stage 1 NSCLC s/p Radiation (2016), OSA (not compliant with CPAP), CAD, DM, HTN, HLD. Recent admission 4/4-4/6 for hypoxic/hypercarbic respiratory failure requiring intubation.  Type of Study: Bedside Swallow Evaluation Previous Swallow Assessment: none in chart Diet Prior to this Study: NPO Temperature Spikes Noted: No Respiratory Status: Nasal cannula History of Recent Intubation: Yes Length of Intubations (days): 3 days Date extubated: 06/22/17 Behavior/Cognition: Alert;Cooperative Oral Cavity Assessment: Within Functional Limits Oral Care Completed by SLP: No Oral Cavity - Dentition: Edentulous;Dentures, not available Vision: Functional for self-feeding Self-Feeding Abilities: Able to feed self Patient Positioning: Upright in bed Baseline Vocal Quality: Normal Volitional Cough: Strong;Other (Comment)(productive) Volitional Swallow: Able to elicit    Oral/Motor/Sensory Function Overall Oral Motor/Sensory Function: Within functional limits   Ice Chips Ice chips: Within functional limits Presentation: Spoon   Thin Liquid Thin Liquid: Impaired Presentation: Self Fed;Cup;Straw Pharyngeal  Phase Impairments: Cough - Delayed    Nectar Thick Nectar Thick Liquid: Not tested   Honey Thick Honey Thick Liquid: Not tested   Puree Puree: Impaired Presentation: Self Fed;Spoon Pharyngeal Phase Impairments: Cough - Delayed   Solid   GO   Solid: Not tested  Germain Osgood 06/23/2017,9:16 AM  Germain Osgood, M.A. CCC-SLP [phone]

## 2017-06-23 NOTE — Progress Notes (Addendum)
PULMONARY / CRITICAL CARE MEDICINE   Name: Manuel Baxter MRN: [id number] DOB: July 20, 1955    ADMISSION DATE:  06/20/2017 CONSULTATION DATE:  06/20/2017  REFERRING MD:  Phylliss Bob, [place]   CHIEF COMPLAINT:  AMS  HISTORY OF PRESENT ILLNESS:   62 year old male with PMH of NASH, COPD (FEV1 23%), on 2.5-3L [place] at baseline, Stage 1 NSCLC s/p Radiation (2016), OSA (not compliant with CPAP), CAD, DM, HTN, HLD   Recent Admission 4/4-4/6 for Hypoxic/Hypercarbic Respiratory Failure requiring intubation   5/18 presents from home with AMS. Wife reports that patient has been increasingly lethargic throughout the day. For the last two days had "shaking" of extremities in which she reports he has had before with hypercarbia. No fever, chills, or sick contacts. Does report that patient will frequently not wear CPAP at HS. On arrival to ED patient was non-responsive requiring intubation. ABG 7.361/88.1/547. PCCM asked to admit.   SUBJECTIVE:  Extubated 5/20 Awake and alert, reports productive cough with thick white sputum & SOB on exertion Wants to get out of ICU  VITAL SIGNS: BP (!) 151/88   Pulse 87   Temp 98.3 F (36.8 C) (Oral)   Resp 17   Ht 5\' 6"  (1.676 m)   Wt 181 lb (82.1 kg)   SpO2 97%   BMI 29.21 kg/m   HEMODYNAMICS:    VENTILATOR SETTINGS:    INTAKE / OUTPUT: I/O last 3 completed shifts: In: 2010.8 [I.V.:1700.8; NG/GT:60; IV Piggyback:250] Out: 745 [Urine:745]  PHYSICAL EXAMINATION:  General:  Obese male, sitting in bed, on 3L nasal cannula, in no acute distress Neuro:  Alert, oriented, follows commands HEENT: Atraumatic, normocephalic, neck supple, No JVD.  Cardiovascular:  RRR, No M/R/G, s1s2 noted, palpable pulses throughout Lungs: Rhonchi throughout, mild expiratory wheezing bilateral upper lobes, symmetrical expansion, no assessory muscle use  Abdomen:  Obese, BS+ x4, tender to LLQ Musculoskeletal:  No acute deformities, 1+ edema bilateral LE Skin:  Warm, dry, No obvious  rashes, lesions, or ulcerations  LABS:  BMET Recent Labs  Lab 06/21/17 0209 06/22/17 0426 06/23/17 0506  NA 141 144 147*  K 4.1 3.1* 3.8  CL 92* 101 103  CO2 39* 32 36*  BUN 16 27* 28*  CREATININE 1.26* 1.09 1.11  GLUCOSE 171* 183* 140*    Electrolytes Recent Labs  Lab 06/21/17 0209 06/22/17 0426 06/23/17 0506  CALCIUM 8.8* 8.5* 8.9  MG 2.0  --  2.2  PHOS 2.2*  --  3.3    CBC Recent Labs  Lab 06/21/17 0209 06/22/17 0426 06/23/17 0506  WBC 9.4 7.4 8.4  HGB 8.2* 7.5* 7.3*  HCT 31.0* 27.9* 27.8*  PLT 232 199 194    Coag's Recent Labs  Lab 06/20/17 2230  APTT 31  INR 1.02    Sepsis Markers Recent Labs  Lab 06/20/17 2308 06/21/17 0133 06/21/17 0209 06/21/17 0747 06/21/17 1009 06/22/17 0426  LATICACIDVEN  --  1.3  --  0.9 1.0  --   PROCALCITON <0.10  --  <0.10  --   --  <0.10    ABG Recent Labs  Lab 06/20/17 2315 06/21/17 0118 06/21/17 1100  PHART 7.361 7.394 7.435  PCO2ART 88.1* 73.9* 56.8*  PO2ART 547.0* 81.0* 59.4*    Liver Enzymes Recent Labs  Lab 06/20/17 2230 06/23/17 0506  AST 20 26  ALT 12* 15*  ALKPHOS 110 86  BILITOT 0.5 0.7  ALBUMIN 3.3* 2.7*    Cardiac Enzymes Recent Labs  Lab 06/21/17 0250 06/21/17 0734 06/21/17 1439  TROPONINI 0.25* 0.70* 0.33*    Glucose Recent Labs  Lab 06/22/17 1439 06/22/17 1719 06/22/17 1916 06/22/17 2325 06/23/17 0504 06/23/17 0741  GLUCAP 161* 149* 143* 162* 139* 112*    Imaging No results found.   STUDIES:  CXR 5/18 >  Endotracheal tube is 5 cm above the carina. Cardiomegaly. Soft tissue prominence in the region of the right hilum and perihilar region. Increased markings in the lung bases. No visible effusions CT Head 5/19 >> No acute intracranial pathology seen on CT. Mild to moderate cortical volume loss and scattered small vessel ischemic microangiopathy. 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 Bilateral proptosis noted. Partial opacification of the  left mastoid air cells.  CULTURES: Blood 5/18 >> Sputum 5/18 >>Normal flora U/A 5/18 > Negative  RVP 5/18 >>neg Strep P 5/18 >> negative  ANTIBIOTICS: Zosyn 5/19 >  SIGNIFICANT EVENTS: 5/18 > Presents to ED  5/20>>Extubated  LINES/TUBES: ETT 5/18 >>5/20  DISCUSSION: 62 year old male with H/O COPD, HF, OSA presents to ED with AMS and acute on chronic hypercarbic/hypoxic respiratory failure requiring intubation. Extubated on 5/20.  ASSESSMENT / PLAN:  PULMONARY A: Acute on Chronic Hypoxic/Hypercarbic Respiratory Failure in setting of OSA (non-compliance with CPAP)  COPD exacerbation OSA (not compliant with CPAP), on 2.5-3L [place] at home H/O Stage 1 NSCLC s/p Radiation (2016).  P:   Extubated 5/20 Continue nebulized bronchodilators Continue budesonide Continue IV steroids 40 q12h (weaned on 5/20 to current dose) as pt with some wheezing on physical exam Resume home Singlulair & Spiriva    CARDIOVASCULAR A:  HTN H/O Diastolic Heart Failure (EF 55% 02/2017 with G1DD), CAD, HTN, HLD Hypotension - mild - Resolved P:  Cardiac monitoring Continue ASA, Plavix Resume home lasix & Atenolol (will begin Atenolol at 50 mg qd, home dose is 50 bid)  RENAL A:   Likely Chronic Renal Insuffiencey - (baseline Scr ~1.4)- improved Hypokalemia-resolved 5/21 Hypernatremia (asymptomatic)- mild P:   Follow I&O's Trend BMP Encourage PO intake  Replace electrolytes as tolerated Avoid Nephrotoxic agents  GASTROINTESTINAL A:   H/O NASH, GERD Hyperammonemia - mild- improving P:   Speech evaluation pending Continue protonix for SUP Continue lactulose for now  HEMATOLOGIC A:   Anemia - chronic. Now Hgb down to 7.3. No clear evidence bleeding. Fecal occult Blood>>Negative P:  Monitor for s/sx of bleeding Trend CBC Heparin SQ for VTE prophylaxis Transfuse for Hgb<7 Resume home Ferrous Sulfate  INFECTIOUS A:   Fever respiratory culture with abundance GPC. Mod GNR>>normal  flora, MRSA swab negative. PCT negative, afebrile P:   Monitor fever curve Trend WBC Follow PCT Discussed with Dr. Halford Chessman, with d/c Zosyn today 5/21  ENDOCRINE A:   DM    P:   Follow CBG's SSI  NEUROLOGIC A:   Acute Encephalopathy (presumed due to hypercarbia +/- narcotics +/- mild hyperammonemia).  Did have some response to Narcan in ED, was not tried on infusion. CT head neg acute issues  H/O Chronic Pain  On dilaudid. Takes about q6 hours. P:   Continue Lactulose Follow [place] Resume home Cymbalta, Remeron, dilaudid  FAMILY  - Updates: Pt and wife updated at beside 5/21  - Inter-disciplinary family meet or Palliative Care meeting due by: 06/28/2017  Pt can be transferred to floor today.   Darel Hong, AGACNP-BC [facility] Pulmonary & Critical Care Medicine Pager [phone] or [phone]  06/23/2017 9:59 AM

## 2017-06-23 NOTE — Progress Notes (Signed)
Patient refusing CPT at this time due to pain.

## 2017-06-24 ENCOUNTER — Inpatient Hospital Stay (HOSPITAL_COMMUNITY): Payer: Medicare Other

## 2017-06-24 LAB — BASIC METABOLIC PANEL
ANION GAP: 6 (ref 5–15)
BUN: 22 mg/dL — ABNORMAL HIGH (ref 6–20)
CALCIUM: 8.9 mg/dL (ref 8.9–10.3)
CO2: 36 mmol/L — ABNORMAL HIGH (ref 22–32)
Chloride: 102 mmol/L (ref 101–111)
Creatinine, Ser: 0.94 mg/dL (ref 0.61–1.24)
GFR calc Af Amer: >60 mL/min (ref >60)
Glucose, Bld: 146 mg/dL — ABNORMAL HIGH (ref 65–99)
POTASSIUM: 3.5 mmol/L (ref 3.5–5.1)
SODIUM: 144 mmol/L (ref 135–145)

## 2017-06-24 LAB — CBC
HEMATOCRIT: 29.1 % — AB (ref 39.0–52.0)
HEMOGLOBIN: 7.5 g/dL — AB (ref 13.0–17.0)
MCH: 22 pg — ABNORMAL LOW (ref 26.0–34.0)
MCHC: 25.8 g/dL — AB (ref 30.0–36.0)
MCV: 85.3 fL (ref 78.0–100.0)
Platelets: 181 10*3/uL (ref 150–400)
RBC: 3.41 MIL/uL — AB (ref 4.22–5.81)
RDW: 16.1 % — ABNORMAL HIGH (ref 11.5–15.5)
WBC: 7.3 10*3/uL (ref 4.0–10.5)

## 2017-06-24 LAB — GLUCOSE, CAPILLARY
GLUCOSE-CAPILLARY: 165 mg/dL — AB (ref 65–99)
GLUCOSE-CAPILLARY: 146 mg/dL — AB (ref 65–99)
GLUCOSE-CAPILLARY: 172 mg/dL — AB (ref 65–99)
GLUCOSE-CAPILLARY: 135 mg/dL — AB (ref 65–99)
Glucose-Capillary: 272 mg/dL — ABNORMAL HIGH (ref 65–99)

## 2017-06-24 MED ORDER — INSULIN ASPART 100 UNIT/ML ~~LOC~~ SOLN: 0.0000 [IU] | Freq: Every day | SUBCUTANEOUS | Status: DC

## 2017-06-24 MED ORDER — INSULIN ASPART 100 UNIT/ML ~~LOC~~ SOLN
0.0000 [IU] | Freq: Three times a day (TID) | SUBCUTANEOUS | Status: DC
  Administered 2017-06-24: 11 [IU] via SUBCUTANEOUS
  Administered 2017-06-24: 4 [IU] via SUBCUTANEOUS

## 2017-06-24 MED ORDER — HYDROMORPHONE HCL 2 MG PO TABS
2.0000 mg | ORAL_TABLET | Freq: Four times a day (QID) | ORAL | Status: DC | PRN
  Administered 2017-06-24 (×2): 2 mg via ORAL
  Filled 2017-06-24 (×2): qty 1

## 2017-06-24 MED ORDER — GLIPIZIDE 5 MG PO TABS
2.5000 mg | ORAL_TABLET | Freq: Two times a day (BID) | ORAL | Status: DC
  Administered 2017-06-24 – 2017-06-25 (×2): 2.5 mg via ORAL
  Filled 2017-06-24 (×2): qty 1

## 2017-06-24 MED ORDER — IPRATROPIUM-ALBUTEROL 0.5-2.5 (3) MG/3ML IN SOLN
3.0000 mL | Freq: Four times a day (QID) | RESPIRATORY_TRACT | Status: DC
  Administered 2017-06-24 – 2017-06-25 (×4): 3 mL via RESPIRATORY_TRACT
  Filled 2017-06-24 (×4): qty 3

## 2017-06-24 MED ORDER — POLYETHYLENE GLYCOL 3350 17 G PO PACK: 17.0000 g | PACK | Freq: Every day | ORAL | Status: DC | PRN

## 2017-06-24 MED ORDER — LACTULOSE 10 GM/15ML PO SOLN: 20.0000 g | Freq: Every day | ORAL | Status: DC

## 2017-06-24 MED ORDER — PREDNISONE 20 MG PO TABS
30.0000 mg | ORAL_TABLET | Freq: Every day | ORAL | Status: DC
  Administered 2017-06-24 – 2017-06-25 (×2): 30 mg via ORAL
  Filled 2017-06-24 (×2): qty 1

## 2017-06-24 NOTE — Progress Notes (Signed)
Pt wore CPAP from home tonight.  Tolerated well with occasional breaks from use during the night.

## 2017-06-24 NOTE — Progress Notes (Signed)
Patient places CPAP on himself with help of his wife. RT will continue to monitor.

## 2017-06-24 NOTE — Progress Notes (Signed)
PULMONARY / CRITICAL CARE MEDICINE   Name: Kenya Shiraishi MRN: [id number] DOB: 1955-10-18    ADMISSION DATE:  06/20/2017 CONSULTATION DATE:  06/20/2017  REFERRING MD:  Wyn Quaker, ER   CHIEF COMPLAINT:  AMS  HISTORY OF PRESENT ILLNESS:   62 yo male [place] presented to ER with dyspnea, hypoxia, altered mental status from COPD exacerbation with acute on chronic hypoxia/hypercapnia in setting of narcotic medication use.  Required intubation in ER.  Followed at [facility].  PMHx of NASH, Stage 1 NSCLC s/p XRT in 2016, OSA, CAD, DM, HTN, HLD.  SUBJECTIVE:  Used CPAP overnight w/o difficulty.  Breathing at baseline.  Did well with swallow assessment.  Tolerating diet.  VITAL SIGNS: BP 134/69 (BP Location: Left Arm)   Pulse 73   Temp 98.1 F (36.7 C) (Oral)   Resp 18   Ht 5\' 6"  (1.676 m)   Wt 186 lb 15.2 oz (84.8 kg)   SpO2 95%   BMI 30.17 kg/m   INTAKE / OUTPUT: I/O last 3 completed shifts: In: 500 [P.O.:300; I.V.:100; IV Piggyback:100] Out: 1175 [Urine:1175]  PHYSICAL EXAMINATION:   General - pleasant Eyes - pupils reactive ENT - no sinus tenderness, no oral exudate, no LAN Cardiac - regular, no murmur Chest - decreased BS, no wheeze Abd - soft, non tender Ext - no edema Skin - no rashes Neuro - normal strength Psych - normal mood   LABS:  BMET Recent Labs  Lab 06/22/17 0426 06/23/17 0506 06/24/17 0254  NA 144 147* 144  K 3.1* 3.8 3.5  CL 101 103 102  CO2 32 36* 36*  BUN 27* 28* 22*  CREATININE 1.09 1.11 0.94  GLUCOSE 183* 140* 146*    Electrolytes Recent Labs  Lab 06/21/17 0209 06/22/17 0426 06/23/17 0506 06/24/17 0254  CALCIUM 8.8* 8.5* 8.9 8.9  MG 2.0  --  2.2  --   PHOS 2.2*  --  3.3  --     CBC Recent Labs  Lab 06/22/17 0426 06/23/17 0506 06/24/17 0254  WBC 7.4 8.4 7.3  HGB 7.5* 7.3* 7.5*  HCT 27.9* 27.8* 29.1*  PLT 199 194 181    Coag's Recent Labs  Lab 06/20/17 2230  APTT 31  INR 1.02    Sepsis Markers Recent  Labs  Lab 06/20/17 2308 06/21/17 0133 06/21/17 0209 06/21/17 0747 06/21/17 1009 06/22/17 0426  LATICACIDVEN  --  1.3  --  0.9 1.0  --   PROCALCITON <0.10  --  <0.10  --   --  <0.10    ABG Recent Labs  Lab 06/20/17 2315 06/21/17 0118 06/21/17 1100  PHART 7.361 7.394 7.435  PCO2ART 88.1* 73.9* 56.8*  PO2ART 547.0* 81.0* 59.4*    Liver Enzymes Recent Labs  Lab 06/20/17 2230 06/23/17 0506  AST 20 26  ALT 12* 15*  ALKPHOS 110 86  BILITOT 0.5 0.7  ALBUMIN 3.3* 2.7*    Cardiac Enzymes Recent Labs  Lab 06/21/17 0250 06/21/17 0734 06/21/17 1439  TROPONINI 0.25* 0.70* 0.33*    Glucose Recent Labs  Lab 06/23/17 1142 06/23/17 1545 06/23/17 2042 06/23/17 2346 06/24/17 0426 06/24/17 0745  GLUCAP 112* 162* 214* 230* 165* 146*    Imaging Dg Chest Port 1 View  Result Date: 06/24/2017 CLINICAL DATA:  COPD EXAM: PORTABLE CHEST 1 VIEW COMPARISON:  06/22/2017 FINDINGS: Cardiac shadow is stable. The lungs are well aerated bilaterally. Patchy atelectatic changes are noted in the bases. Persistent right perihilar density is noted similar to that seen on prior  exams. The endotracheal tube and nasogastric catheter have been removed in the interval. IMPRESSION: Stable appearance of the chest Electronically Signed   By: Inez Catalina M.D.   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  Improved, and back to baseline respiratory status.  Needs PT assessment, and then likely d/c home in next 24  hrs.   ASSESSMENT / PLAN:  COPD exacerbation. Acute on chronic hypoxic/hypercapnic respiratory failure. - oxygen to keep Spo2 88 to 95% - change to prednisone 30 mg daily and wean off as able over next one week - brovana, pulmicort, spiriva, duoneb, singulair, claritin  Obstructive sleep apnea. - CPAP qhs  Hx of HTN, HFpEF, CAD, HLD, CVA. - ASA, tenormin, plavix, lasix   Hx of NASH, GERD. - continue protonix - change lactulose to daily  Anemia of critical illness, chronic disease, and iron deficiency. - continue Ferrous sulfate - f/u CBC  Fever. 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 - resume glucotrol - SSI  Acute metabolic encephalopathy. - resolved  Chronic pain. - remeron, cymbalta - prn dilaudid 2 mg q4h prn (lower than home dose)  DVT prophylaxis - SQ heparin SUP - protonix Nutrition - carb modified Goals of care - full code  Chesley Mires, MD [place] 06/24/2017, 10:58 AM

## 2017-06-24 NOTE — Progress Notes (Signed)
  Speech Language Pathology Treatment: Dysphagia  Patient Details Name: Manuel Baxter MRN: [id number] DOB: 1955-10-12 Today's Date: 06/24/2017 Time: 3532-9924 SLP Time Calculation (min) (ACUTE ONLY): 15 min  Assessment / Plan / Recommendation Clinical Impression  Pt declined solid POs (had just eaten the majority of his breakfast tray) but he drank thin liquids with intermittent coughing noted. This is noted at baseline too, and on MBS on previous date it was unrelated to airway compromise. Oropharyngeal function at that time was [facility]. Pt denies any difficulty with meals, drinks, meds. SLP provided education about the importance of monitoring respiratory status during intake/taking breaks PRN. Also discussed the importance of oral care. No further acute SLP needs identified - will sign off.   HPI HPI: Pt is a 62 year old maleadmitted with AMS and acute on chronic respiratory failure requiring ETT 5/18-5/20. Pt attempted POs with RN s/p extubation wtih coughing, gurgling noted. He was made NPO pending SLP evaluation. PMH of NASH, COPD (FEV1 23%), on 2.5-3L [place] at baseline, Stage 1 NSCLC s/p Radiation (2016), OSA (not compliant with CPAP), CAD, DM, HTN, HLD. Recent admission 4/4-4/6 for hypoxic/hypercarbic respiratory failure requiring intubation.       SLP Plan  All goals met       Recommendations  Diet recommendations: Regular;Thin liquid Liquids provided via: Cup;Straw Medication Administration: Whole meds with liquid Supervision: Patient able to self feed;Intermittent supervision to cue for compensatory strategies Compensations: Slow rate;Small sips/bites Postural Changes and/or Swallow Maneuvers: Seated upright 90 degrees                Oral Care Recommendations: Oral care BID Follow up Recommendations: None SLP Visit Diagnosis: Dysphagia, unspecified (R13.10) Plan: All goals met       GO                Germain Osgood 06/24/2017, 9:54 AM  Germain Osgood, M.A.  CCC-SLP [phone]

## 2017-06-24 NOTE — Evaluation (Signed)
Physical Therapy Evaluation Patient Details Name: Manuel Baxter MRN: [id number] DOB: 05/18/1955 Today's Date: 06/24/2017   History of Present Illness  62 yo male [place] presented to ER with dyspnea, hypoxia, altered mental status from COPD exacerbation with acute on chronic hypoxia/hypercapnia in setting of narcotic medication use. Became unresponsive in ER requiring intubation on 5/18, extubated 5/20. PMH of NASH, COPD (FEV1 23%), on 2.5-3L [place] at baseline, Stage 1 NSCLC s/p Radiation (2016), OSA (not compliant with CPAP), CAD, DM, HTN, HLD. Recent admission 4/4-4/6 for hypoxic/hypercarbic respiratory failure requiring intubation.   Clinical Impression  Since 05/2017 hospitalization pt was able to return to household level ambulation with RW and performing ADLs. Pt currently limited in safe mobility by oxygen desaturation (se General Comments) and decreased activity tolerance due to back pain. Pt currently mod I for bed mobility, supervision for transfers and min guard for ambulation of 40 feet with RW. Pt would benefit from Outpatient level rehab at discharge to improve ambulation with decreased pain and increased endurance. PT will continue to follow acutely to work towards goals.     Follow Up Recommendations Outpatient PT;Supervision - Intermittent    Equipment Recommendations  None recommended by PT    Recommendations for Other Services       Precautions / Restrictions Precautions Precautions: Fall Precaution Comments: watch O2 sats Restrictions Weight Bearing Restrictions: No      Mobility  Bed Mobility Overal bed mobility: Modified Independent             General bed mobility comments: HoB elevated, use of bed rails  Transfers Overall transfer level: Needs assistance Equipment used: Rolling walker (2 wheeled) Transfers: Sit to/from [facility] Sit to Stand: Supervision Stand pivot transfers: Supervision       General transfer comment: supervision  for safety, good power up and steadying in RW  Ambulation/Gait Ambulation/Gait assistance: Min guard Ambulation Distance (Feet): 40 Feet Assistive device: Rolling walker (2 wheeled) Gait Pattern/deviations: Step-through pattern;Decreased stride length Gait velocity: slowed Gait velocity interpretation: <1.8 ft/sec, indicate of risk for recurrent falls General Gait Details: min guard for safety, ambulated 20' oxygen desaturated with SoB and returned to seated on BSC           Balance Overall balance assessment: Needs assistance Sitting-balance support: No upper extremity supported;Feet supported Sitting balance-Leahy Scale: Good     Standing balance support: Bilateral upper extremity supported Standing balance-Leahy Scale: Fair                               Pertinent Vitals/Pain Pain Assessment: 0-10 Pain Score: 6  Pain Location: back Pain Descriptors / Indicators: Constant Pain Intervention(s): Limited activity within patient's tolerance;Monitored during session;Repositioned    Home Living Family/patient expects to be discharged to:: Private residence Living Arrangements: Spouse/significant other;Children Available Help at Discharge: Available 24 hours/day Type of Home: Mobile home Home Access: Ramped entrance     Home Layout: One level Home Equipment: Environmental consultant - 2 wheels;Cane - single point;Bedside commode;Shower seat;Wheelchair - Press photographer      Prior Function Level of Independence: Needs assistance   Gait / Transfers Assistance Needed: pt walks max 77' with RW and uses WC for community  ADL's / Homemaking Assistance Needed: pt wears bedroom slippers to slide on, assist at times for bathing and dressing, sits for both  Comments: RW vs SPC and was using power scooter fortrips out of the home     Hand Dominance  Dominant Hand: Right    Extremity/Trunk Assessment   Upper Extremity Assessment Upper Extremity Assessment: Overall WFL for  tasks assessed    Lower Extremity Assessment Lower Extremity Assessment: RLE deficits/detail;LLE deficits/detail RLE Deficits / Details: ROM WFL, strength in hips limited by increase in back pain, knee and ankle strength grossly assessed at 4/5 RLE: Unable to fully assess due to pain RLE Sensation: (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 ) LLE Deficits / Details: ROM WFL, strength in hips limited by increase in back pain, knee and ankle strength grossly assessed at 4/5 LLE: Unable to fully assess due to pain LLE Sensation: (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 )    Cervical / Trunk Assessment Cervical / Trunk Assessment: Kyphotic  Communication   Communication: No difficulties  Cognition Arousal/Alertness: Awake/alert Behavior During Therapy: Anxious;WFL for tasks assessed/performed Overall Cognitive Status: Within Functional Limits for tasks assessed                                        General Comments General comments (skin integrity, edema, etc.): Pt on 2.5L O2 via nasal cannula, SaO2 94%O2, ambulated on 3L O2 via [place], SaO2 dropped to 83% O2 pt instructed in pursed lipped breathing and returned to room, once seated pt educated on need to control breath both in rate and depth and to work towards equal inhale to exhale. Pt able to increased SaO2 to 93%O2 with controlled breathing         Assessment/Plan    PT Assessment Patient needs continued PT services  PT Problem List Decreased strength;Decreased mobility;Decreased activity tolerance;Cardiopulmonary status limiting activity;Pain       PT Treatment Interventions DME instruction;Gait training;Functional mobility training;Therapeutic activities;Therapeutic exercise;Balance training;Cognitive remediation;Patient/family education    PT Goals (Current goals can be found in the Care Plan section)  Acute Rehab PT Goals Patient Stated Goal: go fishing PT Goal Formulation: With patient/family Potential to  Achieve Goals: Good    Frequency Min 3X/week    AM-PAC PT "6 Clicks" Daily Activity  Outcome Measure Difficulty turning over in bed (including adjusting bedclothes, sheets and blankets)?: A Little Difficulty moving from lying on back to sitting on the side of the bed? : A Little Difficulty sitting down on and standing up from a chair with arms (e.g., wheelchair, bedside commode, etc,.)?: A Little Help needed moving to and from a bed to chair (including a wheelchair)?: A Little Help needed walking in hospital room?: A Little Help needed climbing 3-5 steps with a railing? : A Lot 6 Click Score: 17    End of Session Equipment Utilized During Treatment: Gait belt;Oxygen Activity Tolerance: Treatment limited secondary to medical complications (Comment)(oxygen desaturation) Patient left: in bed;with call bell/phone within reach;with family/visitor present Nurse Communication: Mobility status;Precautions PT Visit Diagnosis: Difficulty in walking, not elsewhere classified (R26.2);Pain Pain - part of body: (back)    Time: 1520-1600 PT Time Calculation (min) (ACUTE ONLY): 40 min   Charges:   PT Evaluation $PT Eval Moderate Complexity: 1 Mod PT Treatments $Gait Training: 8-22 mins $Therapeutic Activity: 8-22 mins   PT G Codes:        Lindzie Boxx B. Migdalia Dk PT, DPT Acute Rehabilitation  [phone] Pager [phone]    [place] 06/24/2017, 4:34 PM

## 2017-06-24 NOTE — Care Management Note (Addendum)
Case Management Note  Patient Details  Name: Manuel Baxter MRN: [id number] Date of Birth: Mar 21, 1955  Subjective/Objective:     From home with wife, he has home oxygen thru the [place] .  Per pt eval rec outpatient physical therapy, will need the MD to write paper script for this.  Patient will also need HHRN for COPD disease management before he starts his outpatient therapy because he can not do both at the same time and the [facility] will go thru his Medicare insurance.  Wife wants him to get outpatient pt instead of HHPT.  He will be [place] with [facility].   Per Cyril Mourning RN, states attending would like patient to follow up with his pcp if he needs further outpt physical therapy. Patient will have HHPT , HHRN at discharge              Action/Plan: DC home with [facility] services when ready.   Expected Discharge Date:                  Expected Discharge Plan:  [place]  In-House Referral:  Clinical Social Work  Discharge planning Services  CM Consult  Post Acute Care Choice:  Home Health Choice offered to:  Patient  DME Arranged:    DME Agency:     HH Arranged:  RN, Disease Management [place] Agency:  [place]  Status of Service:  Completed, signed off  If discussed at [place] of Stay Meetings, dates discussed:    Additional Comments:  Zenon Mayo, RN 06/24/2017, 4:25 PM

## 2017-06-25 LAB — GLUCOSE, CAPILLARY
GLUCOSE-CAPILLARY: 110 mg/dL — AB (ref 65–99)
Glucose-Capillary: 199 mg/dL — ABNORMAL HIGH (ref 65–99)

## 2017-06-25 MED ORDER — LACTULOSE 10 GM/15ML PO SOLN: 20.0000 g | Freq: Every day | ORAL | 0 refills | Status: AC

## 2017-06-25 MED ORDER — HYDROMORPHONE HCL 4 MG PO TABS: 2.0000 mg | ORAL_TABLET | Freq: Four times a day (QID) | ORAL | 0 refills | Status: AC | PRN

## 2017-06-25 MED ORDER — PREDNISONE 10 MG PO TABS: ORAL_TABLET | ORAL | 0 refills | Status: AC

## 2017-06-25 NOTE — Care Management Important Message (Signed)
Important Message  Patient Details  Name: Manuel Baxter MRN: [id number] Date of Birth: Jun 03, 1955   Medicare Important Message Given:  Yes    Sache Sane [address] 06/25/2017, 12:08 PM

## 2017-06-25 NOTE — Discharge Summary (Addendum)
Physician Discharge Summary  Patient ID: Manuel Baxter MRN: [id number] DOB/AGE: 1955-04-20 62 y.o.  Admit date: 06/20/2017 Discharge date: 06/25/2017    Discharge Diagnoses:  Acute Exacerbation of COPD OSA  Hx HTN, HLD, HFpEF, CAD, HLD, CVA Hx NASH, GERD Anemia of Critical Illness, Chronic Disease & Iron Deficiency Fever DM II  Acute Metabolic Encephalopathy  Chronic Pain                                                                       DISCHARGE PLAN BY DIAGNOSIS      Acute Exacerbation of COPD OSA   Discharge Plan: Prednisone taper as below  Resume spiriva, duoneb, singulair, claritin  Resume home O2 at 2-2.5L as previously prescribed  Continue home CPAP as ordered, encouraged patient to use at night and during daytime rest  Follow up at the [place] in [place], [place] post discharge   Hx HTN, HLD, HFpEF, CAD, HLD, CVA  Discharge Plan: Continue tenormin, ASA, plavix, lasix  Hx NASH, GERD  Discharge Plan: Continue protonix, lactulose QD  Anemia of Critical Illness, Chronic Disease & Iron Deficiency  Discharge Plan: Continue ferrous sulfate   Fever  Discharge Plan: Resolved, no acute follow up. Infectious work up negative.   DM II   Discharge Plan: Resume home regimen as below   Acute Metabolic Encephalopathy  Chronic Pain   Discharge Plan: Reduce PRN dilaudid to 22m Q4 PRN, no new Rx given at discharge Continue remeron, cymbalta, melatonin                      DISCHARGE SUMMARY    62y/o M with a PMH of Stage I NSCLC s/p XRT (2016), OSA, CAD, DM, HTN, HLD & COPD [facility] distress and altered mental status. He was initially treated with Narcan with some response.  He failed BiPAP and required intubation in the ER. He was admitted for altered mental status in the setting of acute on chronic hypoxic / hypercapnic respiratory failure in the setting of narcotic use, NASH cirrhosis, OSA and acute exacerbation of  COPD. CT of the head was assessed and negative for acute process but did show atrophy, chronic small vessel disease, old right posterior parietal lobe infarct.  Pan cultures obtained and negative to date.  He was initially empirically treated with zosyn.   He was liberated from the ventilator on 5/20 without difficulty.  The patient was transferred to the medical floor.  He was evaluated by PT and recommended outpatient physical therapy & home health RN.  The patient was medically cleared for discharge 5/23 with plans as above.            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, proptosis b/l  CULTURES: Blood 5/18 >> Urine 5/18 >> negative RVP 5/18 >> negative Strep P 5/18 >> negative Sputum 5/19 >> oral flora  ANTIBIOTICS: Zosyn 5/19 >> 5/20  SIGNIFICANT EVENTS: 5/18 Admit 5/21 Transfer to floor  LINES/TUBES: ETT 5/18 >> 5/20   Discharge Exam: General: adult male in NAD Neuro: AAOx4, speech clear, MAE CV: s1s2 rrr, no m/r/g PULM: even/non-labored, lungs bilaterally without wheezing GI: soft,  non-tender, active BS Extremities: warm/dry, no edema   Vitals:   06/25/17 0754 06/25/17 0759 06/25/17 0804 06/25/17 0822  BP:    (!) 152/67  Pulse: 73   64  Resp: 16   17  Temp:    97.9 F (36.6 C)  TempSrc:    Oral  SpO2: 99% 99% 100%   Weight:      Height:         Discharge Labs  BMET Recent Labs  Lab 06/20/17 2230 06/20/17 2316 06/21/17 0209 06/22/17 0426 06/23/17 0506 06/24/17 0254  NA 142 142 141 144 147* 144  K 3.8 3.7 4.1 3.1* 3.8 3.5  CL 90* 90* 92* 101 103 102  CO2 46*  --  39* 32 36* 36*  GLUCOSE 189* 183* 171* 183* 140* 146*  BUN _0 27* 28* 22*  CREATININE 1.40* 1.30* 1.26* 1.09 1.11 0.94  CALCIUM 9.1  --  8.8* 8.5* 8.9 8.9  MG  --   --  2.0  --  2.2  --   PHOS  --   --  2.2*  --  3.3  --     CBC Recent Labs  Lab 06/22/17 0426 06/23/17 0506 06/24/17 0254  HGB  7.5* 7.3* 7.5*  HCT 27.9* 27.8* 29.1*  WBC 7.4 8.4 7.3  PLT 199 194 181    Anti-Coagulation Recent Labs  Lab 06/20/17 2230  INR 1.02    Discharge Instructions    Diet - low sodium heart healthy   Complete by:  As directed    Increase activity slowly   Complete by:  As directed        Follow-up Information    Dr Jerry Caras [facility] Follow up.   Contact information: [place] [address] PHONE: [phone]           Allergies as of 06/25/2017      Reactions   Doxepin Anaphylaxis   Lisinopril Anaphylaxis   Acetaminophen Other (See Comments)   Elevates liver enzymes,has hx of cirrhosis ( non-alcoholic )   Contrast Media [iodinated Diagnostic Agents] Other (See Comments)   Stopped breathing   Gabapentin Other (See Comments)   Tremors, loopy   Lyrica [pregabalin] Other (See Comments)   nightmares   Prozac [fluoxetine Hcl] Other (See Comments)   Makes him mean      Medication List    TAKE these medications   aspirin EC 81 MG tablet Take 81 mg by mouth daily.   atenolol 50 MG tablet Commonly known as:  TENORMIN Take 50 mg by mouth 2 (two) times daily.   BC FAST PAIN RELIEF ARTHRITIS 1000-65 MG Pack Generic drug:  Aspirin-Caffeine Take 2 packets by mouth every 4 (four) hours as needed (pain/headache).   clopidogrel 75 MG tablet Commonly known as:  PLAVIX Take 75 mg by mouth daily with supper.   DULoxetine 30 MG capsule Commonly known as:  CYMBALTA Take 30 mg by mouth daily.   ferrous sulfate 325 (65 FE) MG tablet Take 325 mg by mouth 3 (three) times daily.   Fish Oil 1000 MG Caps Take 1,000-2,000 mg by mouth See admin instructions. Take 2 capsules (2000 mg) by mouth every morning and 1 capsule (1000 mg) before supper   fluticasone 50 MCG/ACT nasal spray Commonly known as:  FLONASE Place 1 spray into both nostrils daily.   furosemide 40 MG tablet Commonly known as:  LASIX Take 1 tablet (40 mg  total) by mouth daily.  GAVISCON PO Take 1 tablet by mouth daily as needed (indigestion/heartburn).   glipiZIDE 5 MG tablet Commonly known as:  GLUCOTROL Take 2.5 mg by mouth 2 (two) times daily before a meal.   HYDROmorphone 4 MG tablet Commonly known as:  DILAUDID Take 0.5 tablets (2 mg total) by mouth every 6 (six) hours as needed (chronic pain). What changed:  how much to take   ipratropium-albuterol 0.5-2.5 (3) MG/3ML Soln Commonly known as:  DUONEB Take 3 mLs by nebulization every 6 (six) hours.   lactulose 10 GM/15ML solution Commonly known as:  CHRONULAC Take 30 mLs (20 g total) by mouth daily.   loratadine 10 MG tablet Commonly known as:  CLARITIN Take 10 mg by mouth daily.   lubiprostone 8 MCG capsule Commonly known as:  AMITIZA Take 8 mcg by mouth 2 (two) times daily with a meal.   Melatonin 3 MG Tabs Take 3 mg by mouth at bedtime.   mirtazapine 15 MG tablet Commonly known as:  REMERON Take 15 mg by mouth at bedtime.   montelukast 10 MG tablet Commonly known as:  SINGULAIR Take 10 mg by mouth daily with supper.   omeprazole 20 MG capsule Commonly known as:  PRILOSEC Take 20 mg by mouth daily.   OVER THE COUNTER MEDICATION Place 0.5-1 mLs under the tongue See admin instructions. [employer] - place 1/2 dropperful (0.5 ml) under the tongue twice during the day, place 1 dropperful (1 ml) under the tongue at bedtime. 1 ml - 250 mg hemp, 405 mg linoleic acid, 135 mg Alpha Linoleic Acid, 90 m Oleic Acid.   predniSONE 10 MG tablet Commonly known as:  DELTASONE 3 tablets daily for 2 days, then 2 tablets daily for 2 days, then 1 tablet daily for 2 days   PROAIR HFA 108 (90 Base) MCG/ACT inhaler Generic drug:  albuterol Inhale 2 puffs into the lungs every 6 (six) hours as needed for wheezing or shortness of breath.   tiotropium 18 MCG inhalation capsule Commonly known as:  SPIRIVA Place 18 mcg into inhaler and inhale daily.   vitamin B-12  500 MCG tablet Commonly known as:  CYANOCOBALAMIN Take 500 mcg by mouth 2 (two) times daily. Vitamin B12   Vitamin D 2000 units tablet Take 2,000 Units by mouth 2 (two) times daily.         Disposition: Home.  Home health PT, RN ordered at discharge.  Discharged Condition: Manuel Baxter has met maximum benefit of inpatient care and is medically stable and cleared for discharge.  Patient is pending follow up as above.      Time spent on disposition:  35 Minutes.   Signed: Noe Gens, NP-C [facility] Pgr: [phone] Office: [phone]

## 2017-06-25 NOTE — Consult Note (Signed)
   [facility] CM Inpatient Consult   06/25/2017  Manuel Baxter 01-13-1956 [id number]  Late entry:  Spoke with inpatient RNCM, Tomi Bamberger on 06/24/17 @ 1542 pm that this patient is currently with [facility] for his primary care provider office.  Patient has multiple hospitalizations. Inpatient care management notes confirms his primary care is with the [facility] not in [place] [facility] network.  Natividad Brood, RN BSN [facility] Liaison  [phone] business mobile phone Toll free office [phone]

## 2017-06-25 NOTE — Plan of Care (Signed)
Patient is progressing well. He is ambulating with supervision and with minimal assistant to [facility]. He reports that his knowledge about how to operate CPAP at night is improving. Will continue to monitor and assess patient.

## 2017-06-26 LAB — CULTURE, BLOOD (ROUTINE X 2)
CULTURE: NO GROWTH
Culture: NO GROWTH
SPECIAL REQUESTS: ADEQUATE

## 2017-06-30 DIAGNOSIS — J9621 Acute and chronic respiratory failure with hypoxia: Secondary | ICD-10-CM | POA: Diagnosis not present

## 2017-06-30 DIAGNOSIS — J441 Chronic obstructive pulmonary disease with (acute) exacerbation: Secondary | ICD-10-CM | POA: Diagnosis not present

## 2017-07-02 DIAGNOSIS — J9621 Acute and chronic respiratory failure with hypoxia: Secondary | ICD-10-CM | POA: Diagnosis not present

## 2017-07-02 DIAGNOSIS — J441 Chronic obstructive pulmonary disease with (acute) exacerbation: Secondary | ICD-10-CM | POA: Diagnosis not present

## 2017-07-08 DIAGNOSIS — J441 Chronic obstructive pulmonary disease with (acute) exacerbation: Secondary | ICD-10-CM | POA: Diagnosis not present

## 2017-07-08 DIAGNOSIS — J9621 Acute and chronic respiratory failure with hypoxia: Secondary | ICD-10-CM | POA: Diagnosis not present

## 2017-07-10 DIAGNOSIS — J441 Chronic obstructive pulmonary disease with (acute) exacerbation: Secondary | ICD-10-CM | POA: Diagnosis not present

## 2017-07-10 DIAGNOSIS — J9621 Acute and chronic respiratory failure with hypoxia: Secondary | ICD-10-CM | POA: Diagnosis not present

## 2017-07-14 DIAGNOSIS — J441 Chronic obstructive pulmonary disease with (acute) exacerbation: Secondary | ICD-10-CM | POA: Diagnosis not present

## 2017-07-14 DIAGNOSIS — J9621 Acute and chronic respiratory failure with hypoxia: Secondary | ICD-10-CM | POA: Diagnosis not present

## 2017-07-16 DIAGNOSIS — J9621 Acute and chronic respiratory failure with hypoxia: Secondary | ICD-10-CM | POA: Diagnosis not present

## 2017-07-16 DIAGNOSIS — J441 Chronic obstructive pulmonary disease with (acute) exacerbation: Secondary | ICD-10-CM | POA: Diagnosis not present

## 2017-07-21 DIAGNOSIS — J9621 Acute and chronic respiratory failure with hypoxia: Secondary | ICD-10-CM | POA: Diagnosis not present

## 2017-07-21 DIAGNOSIS — J441 Chronic obstructive pulmonary disease with (acute) exacerbation: Secondary | ICD-10-CM | POA: Diagnosis not present

## 2017-07-22 DIAGNOSIS — J9621 Acute and chronic respiratory failure with hypoxia: Secondary | ICD-10-CM | POA: Diagnosis not present

## 2017-07-22 DIAGNOSIS — J441 Chronic obstructive pulmonary disease with (acute) exacerbation: Secondary | ICD-10-CM | POA: Diagnosis not present

## 2017-07-24 DIAGNOSIS — J441 Chronic obstructive pulmonary disease with (acute) exacerbation: Secondary | ICD-10-CM | POA: Diagnosis not present

## 2017-07-24 DIAGNOSIS — J9621 Acute and chronic respiratory failure with hypoxia: Secondary | ICD-10-CM | POA: Diagnosis not present

## 2017-07-28 DIAGNOSIS — J9621 Acute and chronic respiratory failure with hypoxia: Secondary | ICD-10-CM | POA: Diagnosis not present

## 2017-07-28 DIAGNOSIS — J441 Chronic obstructive pulmonary disease with (acute) exacerbation: Secondary | ICD-10-CM | POA: Diagnosis not present

## 2017-07-30 DIAGNOSIS — J441 Chronic obstructive pulmonary disease with (acute) exacerbation: Secondary | ICD-10-CM | POA: Diagnosis not present

## 2017-07-30 DIAGNOSIS — J9621 Acute and chronic respiratory failure with hypoxia: Secondary | ICD-10-CM | POA: Diagnosis not present

## 2017-08-03 DIAGNOSIS — J441 Chronic obstructive pulmonary disease with (acute) exacerbation: Secondary | ICD-10-CM | POA: Diagnosis not present

## 2017-08-03 DIAGNOSIS — J9621 Acute and chronic respiratory failure with hypoxia: Secondary | ICD-10-CM | POA: Diagnosis not present

## 2017-11-10 DIAGNOSIS — R918 Other nonspecific abnormal finding of lung field: Secondary | ICD-10-CM | POA: Diagnosis not present

## 2017-12-17 DIAGNOSIS — J441 Chronic obstructive pulmonary disease with (acute) exacerbation: Secondary | ICD-10-CM | POA: Diagnosis not present

## 2017-12-17 DIAGNOSIS — R0689 Other abnormalities of breathing: Secondary | ICD-10-CM | POA: Diagnosis not present

## 2017-12-17 DIAGNOSIS — R069 Unspecified abnormalities of breathing: Secondary | ICD-10-CM | POA: Diagnosis not present

## 2017-12-17 DIAGNOSIS — R062 Wheezing: Secondary | ICD-10-CM | POA: Diagnosis not present

## 2017-12-17 DIAGNOSIS — J9622 Acute and chronic respiratory failure with hypercapnia: Secondary | ICD-10-CM | POA: Diagnosis not present

## 2017-12-17 DIAGNOSIS — R05 Cough: Secondary | ICD-10-CM | POA: Diagnosis not present

## 2017-12-17 DIAGNOSIS — G4733 Obstructive sleep apnea (adult) (pediatric): Secondary | ICD-10-CM | POA: Diagnosis not present

## 2017-12-17 DIAGNOSIS — R0902 Hypoxemia: Secondary | ICD-10-CM | POA: Diagnosis not present

## 2017-12-17 DIAGNOSIS — R0602 Shortness of breath: Secondary | ICD-10-CM | POA: Diagnosis not present

## 2017-12-17 DIAGNOSIS — J9621 Acute and chronic respiratory failure with hypoxia: Secondary | ICD-10-CM | POA: Diagnosis not present

## 2017-12-18 DIAGNOSIS — G4733 Obstructive sleep apnea (adult) (pediatric): Secondary | ICD-10-CM | POA: Diagnosis not present

## 2017-12-18 DIAGNOSIS — J9621 Acute and chronic respiratory failure with hypoxia: Secondary | ICD-10-CM | POA: Diagnosis not present

## 2017-12-18 DIAGNOSIS — J441 Chronic obstructive pulmonary disease with (acute) exacerbation: Secondary | ICD-10-CM | POA: Diagnosis not present

## 2017-12-18 DIAGNOSIS — R0602 Shortness of breath: Secondary | ICD-10-CM | POA: Diagnosis not present

## 2017-12-18 DIAGNOSIS — J9622 Acute and chronic respiratory failure with hypercapnia: Secondary | ICD-10-CM | POA: Diagnosis not present

## 2017-12-19 DIAGNOSIS — J9621 Acute and chronic respiratory failure with hypoxia: Secondary | ICD-10-CM | POA: Diagnosis not present

## 2017-12-19 DIAGNOSIS — J441 Chronic obstructive pulmonary disease with (acute) exacerbation: Secondary | ICD-10-CM | POA: Diagnosis not present

## 2017-12-19 DIAGNOSIS — R0602 Shortness of breath: Secondary | ICD-10-CM | POA: Diagnosis not present

## 2017-12-19 DIAGNOSIS — G4733 Obstructive sleep apnea (adult) (pediatric): Secondary | ICD-10-CM | POA: Diagnosis not present

## 2017-12-19 DIAGNOSIS — J9622 Acute and chronic respiratory failure with hypercapnia: Secondary | ICD-10-CM | POA: Diagnosis not present

## 2017-12-20 DIAGNOSIS — J9621 Acute and chronic respiratory failure with hypoxia: Secondary | ICD-10-CM | POA: Diagnosis not present

## 2017-12-20 DIAGNOSIS — J9622 Acute and chronic respiratory failure with hypercapnia: Secondary | ICD-10-CM | POA: Diagnosis not present

## 2017-12-20 DIAGNOSIS — G4733 Obstructive sleep apnea (adult) (pediatric): Secondary | ICD-10-CM | POA: Diagnosis not present

## 2017-12-20 DIAGNOSIS — J441 Chronic obstructive pulmonary disease with (acute) exacerbation: Secondary | ICD-10-CM | POA: Diagnosis not present

## 2017-12-20 DIAGNOSIS — R0602 Shortness of breath: Secondary | ICD-10-CM | POA: Diagnosis not present

## 2017-12-23 ENCOUNTER — Other Ambulatory Visit: Payer: Self-pay

## 2017-12-23 NOTE — Patient Outreach (Signed)
New referral for transition of care:  Reviewed medical record. Unknown place of discharge.   Reviewed [facility] and Epic.   Placed call to patient. No answer. Left a message requesting a call back.  Plan: will reattempt on 12/28/2017. 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 Will await a call back.  Tomasa Rand, RN, BSN, CEN [facility] [employer] [phone]

## 2017-12-28 ENCOUNTER — Other Ambulatory Visit: Payer: Self-pay

## 2017-12-28 NOTE — Patient Outreach (Signed)
Transition of care: Placed call to patient for 2nd attempt. No answer.  PLAN: will call again in 4 days. Already mailed letter.  Tomasa Rand, RN, BSN, CEN [facility] [employer] [phone]

## 2018-01-01 ENCOUNTER — Other Ambulatory Visit: Payer: Self-pay | Admitting: *Deleted

## 2018-01-01 NOTE — Patient Outreach (Signed)
[facility]) Care Management  01/01/2018  Manuel Baxter Feb 20, 1955 [id number]   Transition of care  Call attempt #3  Placed call to patient, no answer able to leave a HIPAA compliant message for return call   Plan  unsuccessful outreach call. , will notify assigned care manager, Tomasa Rand of unsuccessful outreach., if patient does not return call on today.    Joylene Draft, RN, [place] Coordinator  [phone]- Mobile [phone]- Toll Free Main Office

## 2018-01-06 ENCOUNTER — Other Ambulatory Visit: Payer: Self-pay

## 2018-01-06 NOTE — Patient Outreach (Signed)
Case closure: Unable to reach patient x 3 calls and outreach letter. PLAN: Case closed.  Tomasa Rand, RN, BSN, CEN [facility] [employer] [phone]

## 2019-04-05 IMAGING — DX DG CHEST 1V PORT
1 series · 1 of 1 positions shown · non-contrast
Comparison: Portable exam 2194 hours compared 02/06/2017

Correlation CT chest 01/14/2017

CLINICAL DATA: Acute respiratory failure with hypoxia, COPD, lung
cancer, smoker

EXAM:
PORTABLE CHEST 1 VIEW

[chest ap]
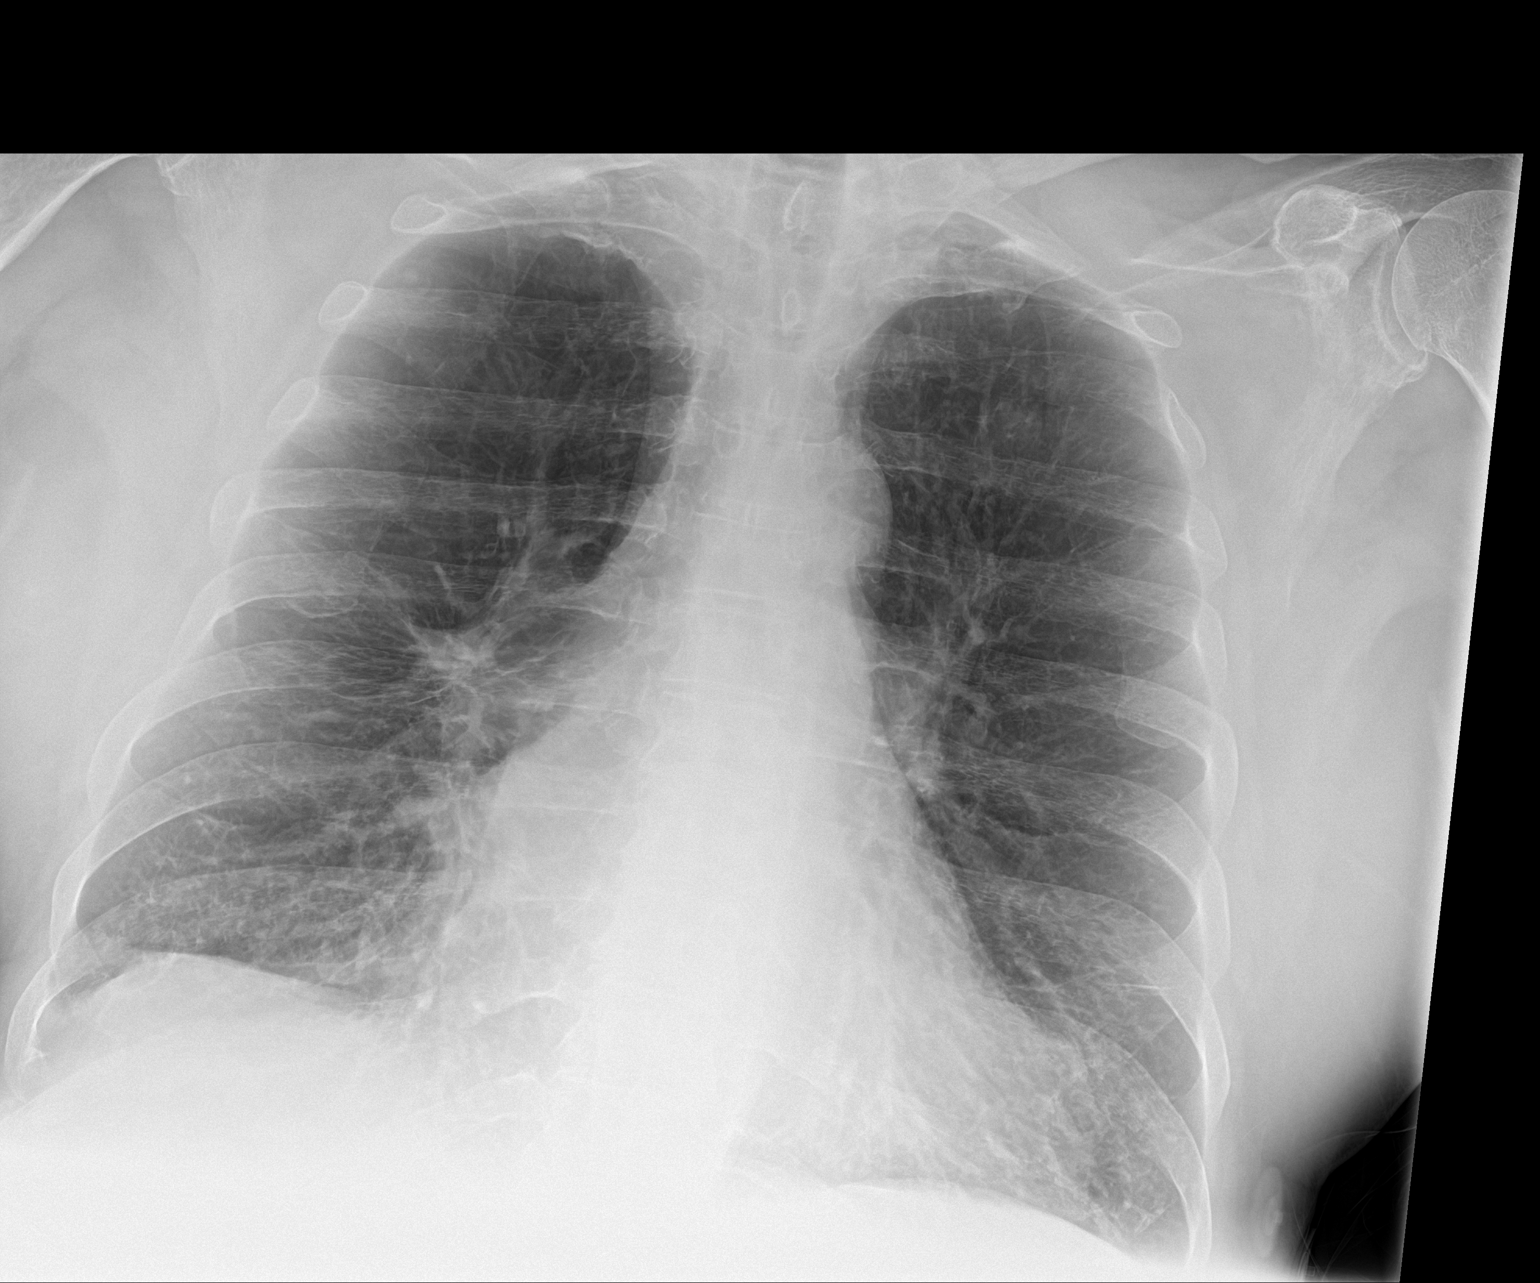

[1 of 1 positions shown; findings below may reference images not displayed]

FINDINGS: Upper normal heart size.

Normal mediastinal contours and pulmonary vascularity.

Emphysematous changes with stellate density again identified at the
lateral margin of the RIGHT hilum likely reflecting previously
identified RIGHT perihilar mass with associated atelectasis.

Persistent interstitial prominence at the lower lungs.

No new infiltrate, pleural effusion or pneumothorax.

Question developing vague nodular density 11 mm diameter versus area
of infiltrate or summation artifact at LEFT upper lobe; with no
definite pulmonary nodule at this site on most recent CT, favor
artifact.

Bones demineralized.
IMPRESSION: Stellate density at RIGHT perihilar corresponding to known mass with
atelectasis/retraction.

COPD changes with chronic interstitial prominence at the lower
lungs.

Question summation artifact versus developing nodule 11 mm diameter
at LEFT apex, favor artifact as above.

## 2019-06-21 IMAGING — NM NM PULMONARY VENT & PERF
16 series · 16 of 16 positions shown · non-contrast
Comparison: Chest x-ray today as well as CT 01/14/2017.

CLINICAL DATA: Evaluate for possible pulmonary embolism. History of
obstructive sleep apnea and COPD. Smoker. Productive cough and
worsening shortness of breath. History of lung cancer.

EXAM:
NUCLEAR MEDICINE VENTILATION - PERFUSION LUNG SCAN
TECHNIQUE: Ventilation images were obtained in multiple projections using
inhaled aerosol Oc-PPm DTPA. Perfusion images were obtained in
multiple projections after intravenous injection of [id number].
RADIOPHARMACEUTICALS:  32.8 mCi of Oc-PPm DTPA aerosol inhalation
and 4.33 mCi [id number]

[Series 1: ant/post vent · 4.14mm/px · 1 of 1 slices shown (1 of 2)]
[im 1/1]
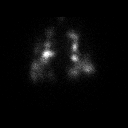

[Series 1: ant/post vent · 4.14mm/px · 1 of 1 slices shown (2 of 2)]
[im 1/1]
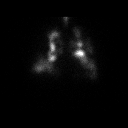

[Series 2: lao/rpo vent · 4.14mm/px · 1 of 1 slices shown (1 of 2)]
[im 1/1]
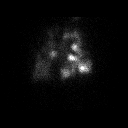

[Series 2: lao/rpo vent · 4.14mm/px · 1 of 1 slices shown (2 of 2)]
[im 1/1]
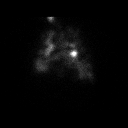

[Series 3: lpo/rao vent · 4.14mm/px · 1 of 1 slices shown (1 of 2)]
[im 1/1]
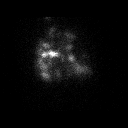

[Series 3: lpo/rao vent · 4.14mm/px · 1 of 1 slices shown (2 of 2)]
[im 1/1]
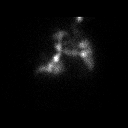

[Series 4: lt lat/rt lat vent · 4.14mm/px · 1 of 1 slices shown (1 of 2)]
[im 1/1]
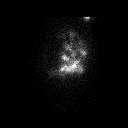

[Series 4: lt lat/rt lat vent · 4.14mm/px · 1 of 1 slices shown (2 of 2)]
[im 1/1]
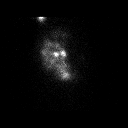

[Series 5: lt lat/rt lat perf · 4.14mm/px · 1 of 1 slices shown (1 of 2)]
[im 1/1]
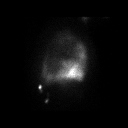

[Series 5: lt lat/rt lat perf · 4.14mm/px · 1 of 1 slices shown (2 of 2)]
[im 1/1]
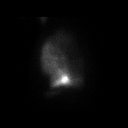

[Series 6: lpo/rao perf · 4.14mm/px · 1 of 1 slices shown (1 of 2)]
[im 1/1]
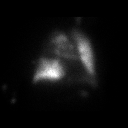

[Series 6: lpo/rao perf · 4.14mm/px · 1 of 1 slices shown (2 of 2)]
[im 1/1]
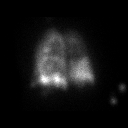

[Series 7: ant/post perf · 4.14mm/px · 1 of 1 slices shown (1 of 2)]
[im 1/1]
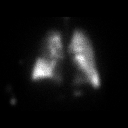

[Series 7: ant/post perf · 4.14mm/px · 1 of 1 slices shown (2 of 2)]
[im 1/1]
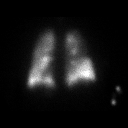

[Series 8: lao/rpo perf · 4.14mm/px · 1 of 1 slices shown (1 of 2)]
[im 1/1]
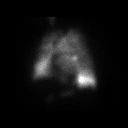

[Series 8: lao/rpo perf · 4.14mm/px · 1 of 1 slices shown (2 of 2)]
[im 1/1]
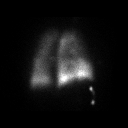

[16 of 16 positions shown; findings below may reference images not displayed]

FINDINGS: Chest x-ray demonstrates irregular opacification over the right
hilar region.

Ventilation: Moderate bilateral central airway clumping of
radiotracer. Decrease uptake over the right midlung.

Perfusion: Mild shine through of central airway clumping of
radiotracer. No wedge shaped peripheral mismatched perfusion defects
to suggest acute pulmonary embolism. Matched area of decreased
perfusion over the right midlung.
IMPRESSION: Findings compatible with low probability for pulmonary embolism.

## 2019-12-07 DIAGNOSIS — R000 Tachycardia, unspecified: Secondary | ICD-10-CM | POA: Diagnosis not present

## 2019-12-07 DIAGNOSIS — R0602 Shortness of breath: Secondary | ICD-10-CM | POA: Diagnosis not present

## 2019-12-07 DIAGNOSIS — J9602 Acute respiratory failure with hypercapnia: Secondary | ICD-10-CM | POA: Diagnosis not present

## 2019-12-07 DIAGNOSIS — R069 Unspecified abnormalities of breathing: Secondary | ICD-10-CM | POA: Diagnosis not present

## 2019-12-07 DIAGNOSIS — J449 Chronic obstructive pulmonary disease, unspecified: Secondary | ICD-10-CM | POA: Diagnosis not present

## 2019-12-07 DIAGNOSIS — X58XXXA Exposure to other specified factors, initial encounter: Secondary | ICD-10-CM | POA: Diagnosis not present

## 2019-12-07 DIAGNOSIS — I10 Essential (primary) hypertension: Secondary | ICD-10-CM | POA: Diagnosis not present

## 2019-12-07 DIAGNOSIS — Z8709 Personal history of other diseases of the respiratory system: Secondary | ICD-10-CM | POA: Diagnosis not present

## 2019-12-07 DIAGNOSIS — R400 Somnolence: Secondary | ICD-10-CM | POA: Diagnosis not present

## 2019-12-07 DIAGNOSIS — Z20822 Contact with and (suspected) exposure to covid-19: Secondary | ICD-10-CM | POA: Diagnosis not present

## 2019-12-07 DIAGNOSIS — T402X1A Poisoning by other opioids, accidental (unintentional), initial encounter: Secondary | ICD-10-CM | POA: Diagnosis not present

## 2019-12-07 DIAGNOSIS — I219 Acute myocardial infarction, unspecified: Secondary | ICD-10-CM | POA: Diagnosis not present

## 2019-12-07 DIAGNOSIS — J80 Acute respiratory distress syndrome: Secondary | ICD-10-CM | POA: Diagnosis not present

## 2019-12-07 DIAGNOSIS — G9389 Other specified disorders of brain: Secondary | ICD-10-CM | POA: Diagnosis not present

## 2019-12-07 DIAGNOSIS — R404 Transient alteration of awareness: Secondary | ICD-10-CM | POA: Diagnosis not present

## 2019-12-07 DIAGNOSIS — E1165 Type 2 diabetes mellitus with hyperglycemia: Secondary | ICD-10-CM | POA: Diagnosis not present

## 2019-12-07 DIAGNOSIS — G319 Degenerative disease of nervous system, unspecified: Secondary | ICD-10-CM | POA: Diagnosis not present

## 2019-12-07 DIAGNOSIS — F17210 Nicotine dependence, cigarettes, uncomplicated: Secondary | ICD-10-CM | POA: Diagnosis not present

## 2019-12-07 DIAGNOSIS — R4182 Altered mental status, unspecified: Secondary | ICD-10-CM | POA: Diagnosis not present

## 2019-12-07 DIAGNOSIS — J9601 Acute respiratory failure with hypoxia: Secondary | ICD-10-CM | POA: Diagnosis not present

## 2019-12-07 DIAGNOSIS — I2510 Atherosclerotic heart disease of native coronary artery without angina pectoris: Secondary | ICD-10-CM | POA: Diagnosis not present

## 2019-12-07 DIAGNOSIS — J441 Chronic obstructive pulmonary disease with (acute) exacerbation: Secondary | ICD-10-CM | POA: Diagnosis not present

## 2019-12-08 DIAGNOSIS — I10 Essential (primary) hypertension: Secondary | ICD-10-CM | POA: Diagnosis not present

## 2019-12-08 DIAGNOSIS — E872 Acidosis: Secondary | ICD-10-CM | POA: Diagnosis not present

## 2019-12-08 DIAGNOSIS — R0689 Other abnormalities of breathing: Secondary | ICD-10-CM | POA: Diagnosis not present

## 2019-12-08 DIAGNOSIS — J9622 Acute and chronic respiratory failure with hypercapnia: Secondary | ICD-10-CM | POA: Diagnosis not present

## 2019-12-08 DIAGNOSIS — J441 Chronic obstructive pulmonary disease with (acute) exacerbation: Secondary | ICD-10-CM | POA: Diagnosis not present

## 2019-12-08 DIAGNOSIS — J9621 Acute and chronic respiratory failure with hypoxia: Secondary | ICD-10-CM | POA: Diagnosis not present

## 2019-12-08 DIAGNOSIS — G9341 Metabolic encephalopathy: Secondary | ICD-10-CM | POA: Diagnosis not present

## 2019-12-09 DIAGNOSIS — J9602 Acute respiratory failure with hypercapnia: Secondary | ICD-10-CM | POA: Diagnosis not present

## 2019-12-09 DIAGNOSIS — G4733 Obstructive sleep apnea (adult) (pediatric): Secondary | ICD-10-CM | POA: Diagnosis not present

## 2019-12-09 DIAGNOSIS — R0689 Other abnormalities of breathing: Secondary | ICD-10-CM | POA: Diagnosis not present

## 2019-12-09 DIAGNOSIS — E872 Acidosis: Secondary | ICD-10-CM | POA: Diagnosis not present

## 2019-12-09 DIAGNOSIS — E1165 Type 2 diabetes mellitus with hyperglycemia: Secondary | ICD-10-CM | POA: Diagnosis not present

## 2019-12-09 DIAGNOSIS — J449 Chronic obstructive pulmonary disease, unspecified: Secondary | ICD-10-CM | POA: Diagnosis not present

## 2019-12-09 DIAGNOSIS — J441 Chronic obstructive pulmonary disease with (acute) exacerbation: Secondary | ICD-10-CM | POA: Diagnosis not present

## 2019-12-09 DIAGNOSIS — I252 Old myocardial infarction: Secondary | ICD-10-CM | POA: Diagnosis not present

## 2019-12-09 DIAGNOSIS — J9601 Acute respiratory failure with hypoxia: Secondary | ICD-10-CM | POA: Diagnosis not present

## 2019-12-10 DIAGNOSIS — J441 Chronic obstructive pulmonary disease with (acute) exacerbation: Secondary | ICD-10-CM | POA: Diagnosis not present

## 2019-12-10 DIAGNOSIS — E872 Acidosis: Secondary | ICD-10-CM | POA: Diagnosis not present

## 2019-12-10 DIAGNOSIS — E1165 Type 2 diabetes mellitus with hyperglycemia: Secondary | ICD-10-CM | POA: Diagnosis not present

## 2019-12-10 DIAGNOSIS — R0689 Other abnormalities of breathing: Secondary | ICD-10-CM | POA: Diagnosis not present

## 2019-12-10 DIAGNOSIS — J9601 Acute respiratory failure with hypoxia: Secondary | ICD-10-CM | POA: Diagnosis not present

## 2019-12-10 DIAGNOSIS — G4733 Obstructive sleep apnea (adult) (pediatric): Secondary | ICD-10-CM | POA: Diagnosis not present

## 2019-12-10 DIAGNOSIS — J449 Chronic obstructive pulmonary disease, unspecified: Secondary | ICD-10-CM | POA: Diagnosis not present

## 2019-12-10 DIAGNOSIS — J9602 Acute respiratory failure with hypercapnia: Secondary | ICD-10-CM | POA: Diagnosis not present

## 2019-12-10 DIAGNOSIS — I252 Old myocardial infarction: Secondary | ICD-10-CM | POA: Diagnosis not present

## 2019-12-11 DIAGNOSIS — J9601 Acute respiratory failure with hypoxia: Secondary | ICD-10-CM | POA: Diagnosis not present

## 2019-12-11 DIAGNOSIS — R0689 Other abnormalities of breathing: Secondary | ICD-10-CM | POA: Diagnosis not present

## 2019-12-11 DIAGNOSIS — E1165 Type 2 diabetes mellitus with hyperglycemia: Secondary | ICD-10-CM | POA: Diagnosis not present

## 2019-12-11 DIAGNOSIS — I252 Old myocardial infarction: Secondary | ICD-10-CM | POA: Diagnosis not present

## 2019-12-11 DIAGNOSIS — G4733 Obstructive sleep apnea (adult) (pediatric): Secondary | ICD-10-CM | POA: Diagnosis not present

## 2019-12-11 DIAGNOSIS — J9602 Acute respiratory failure with hypercapnia: Secondary | ICD-10-CM | POA: Diagnosis not present

## 2019-12-11 DIAGNOSIS — E872 Acidosis: Secondary | ICD-10-CM | POA: Diagnosis not present

## 2019-12-11 DIAGNOSIS — J449 Chronic obstructive pulmonary disease, unspecified: Secondary | ICD-10-CM | POA: Diagnosis not present

## 2019-12-11 DIAGNOSIS — J441 Chronic obstructive pulmonary disease with (acute) exacerbation: Secondary | ICD-10-CM | POA: Diagnosis not present

## 2019-12-12 DIAGNOSIS — R531 Weakness: Secondary | ICD-10-CM | POA: Diagnosis not present

## 2019-12-13 DIAGNOSIS — J9610 Chronic respiratory failure, unspecified whether with hypoxia or hypercapnia: Secondary | ICD-10-CM | POA: Diagnosis not present

## 2019-12-13 DIAGNOSIS — J449 Chronic obstructive pulmonary disease, unspecified: Secondary | ICD-10-CM | POA: Diagnosis not present

## 2020-01-11 DIAGNOSIS — J3489 Other specified disorders of nose and nasal sinuses: Secondary | ICD-10-CM | POA: Diagnosis not present

## 2020-01-11 DIAGNOSIS — R42 Dizziness and giddiness: Secondary | ICD-10-CM | POA: Diagnosis not present

## 2020-01-11 DIAGNOSIS — Z720 Tobacco use: Secondary | ICD-10-CM | POA: Diagnosis not present

## 2020-01-11 DIAGNOSIS — H5713 Ocular pain, bilateral: Secondary | ICD-10-CM | POA: Diagnosis not present

## 2020-01-11 DIAGNOSIS — Z9981 Dependence on supplemental oxygen: Secondary | ICD-10-CM | POA: Diagnosis not present

## 2020-01-11 DIAGNOSIS — J441 Chronic obstructive pulmonary disease with (acute) exacerbation: Secondary | ICD-10-CM | POA: Diagnosis not present

## 2020-01-12 DIAGNOSIS — J449 Chronic obstructive pulmonary disease, unspecified: Secondary | ICD-10-CM | POA: Diagnosis not present

## 2020-01-12 DIAGNOSIS — J9610 Chronic respiratory failure, unspecified whether with hypoxia or hypercapnia: Secondary | ICD-10-CM | POA: Diagnosis not present

## 2020-01-21 DIAGNOSIS — J159 Unspecified bacterial pneumonia: Secondary | ICD-10-CM | POA: Diagnosis not present

## 2020-01-21 DIAGNOSIS — G4733 Obstructive sleep apnea (adult) (pediatric): Secondary | ICD-10-CM | POA: Diagnosis not present

## 2020-01-21 DIAGNOSIS — J189 Pneumonia, unspecified organism: Secondary | ICD-10-CM | POA: Diagnosis not present

## 2020-01-21 DIAGNOSIS — R778 Other specified abnormalities of plasma proteins: Secondary | ICD-10-CM | POA: Diagnosis not present

## 2020-01-21 DIAGNOSIS — J90 Pleural effusion, not elsewhere classified: Secondary | ICD-10-CM | POA: Diagnosis not present

## 2020-01-21 DIAGNOSIS — J440 Chronic obstructive pulmonary disease with acute lower respiratory infection: Secondary | ICD-10-CM | POA: Diagnosis not present

## 2020-01-21 DIAGNOSIS — Z66 Do not resuscitate: Secondary | ICD-10-CM | POA: Diagnosis not present

## 2020-01-21 DIAGNOSIS — Z20822 Contact with and (suspected) exposure to covid-19: Secondary | ICD-10-CM | POA: Diagnosis not present

## 2020-01-21 DIAGNOSIS — I491 Atrial premature depolarization: Secondary | ICD-10-CM | POA: Diagnosis not present

## 2020-01-21 DIAGNOSIS — F17200 Nicotine dependence, unspecified, uncomplicated: Secondary | ICD-10-CM | POA: Diagnosis not present

## 2020-01-21 DIAGNOSIS — J9612 Chronic respiratory failure with hypercapnia: Secondary | ICD-10-CM | POA: Diagnosis not present

## 2020-01-21 DIAGNOSIS — J441 Chronic obstructive pulmonary disease with (acute) exacerbation: Secondary | ICD-10-CM | POA: Diagnosis not present

## 2020-01-21 DIAGNOSIS — D1779 Benign lipomatous neoplasm of other sites: Secondary | ICD-10-CM | POA: Diagnosis not present

## 2020-01-21 DIAGNOSIS — D509 Iron deficiency anemia, unspecified: Secondary | ICD-10-CM | POA: Diagnosis not present

## 2020-01-21 DIAGNOSIS — Z9981 Dependence on supplemental oxygen: Secondary | ICD-10-CM | POA: Diagnosis not present

## 2020-01-21 DIAGNOSIS — J9611 Chronic respiratory failure with hypoxia: Secondary | ICD-10-CM | POA: Diagnosis not present

## 2020-01-21 DIAGNOSIS — I10 Essential (primary) hypertension: Secondary | ICD-10-CM | POA: Diagnosis not present

## 2020-01-21 DIAGNOSIS — Z8507 Personal history of malignant neoplasm of pancreas: Secondary | ICD-10-CM | POA: Diagnosis not present

## 2020-01-21 DIAGNOSIS — K7460 Unspecified cirrhosis of liver: Secondary | ICD-10-CM | POA: Diagnosis not present

## 2020-01-21 DIAGNOSIS — Z789 Other specified health status: Secondary | ICD-10-CM | POA: Diagnosis not present

## 2020-01-21 DIAGNOSIS — E119 Type 2 diabetes mellitus without complications: Secondary | ICD-10-CM | POA: Diagnosis not present

## 2020-01-21 DIAGNOSIS — E662 Morbid (severe) obesity with alveolar hypoventilation: Secondary | ICD-10-CM | POA: Diagnosis not present

## 2020-01-21 DIAGNOSIS — J850 Gangrene and necrosis of lung: Secondary | ICD-10-CM | POA: Diagnosis not present

## 2020-01-21 DIAGNOSIS — Z515 Encounter for palliative care: Secondary | ICD-10-CM | POA: Diagnosis not present

## 2020-01-21 DIAGNOSIS — E871 Hypo-osmolality and hyponatremia: Secondary | ICD-10-CM | POA: Diagnosis not present

## 2020-01-21 DIAGNOSIS — N200 Calculus of kidney: Secondary | ICD-10-CM | POA: Diagnosis not present

## 2020-01-21 DIAGNOSIS — J188 Other pneumonia, unspecified organism: Secondary | ICD-10-CM | POA: Diagnosis not present

## 2020-01-21 DIAGNOSIS — Z7409 Other reduced mobility: Secondary | ICD-10-CM | POA: Diagnosis not present

## 2020-01-21 DIAGNOSIS — J449 Chronic obstructive pulmonary disease, unspecified: Secondary | ICD-10-CM | POA: Diagnosis not present

## 2020-01-21 DIAGNOSIS — Z794 Long term (current) use of insulin: Secondary | ICD-10-CM | POA: Diagnosis not present

## 2020-01-21 DIAGNOSIS — R109 Unspecified abdominal pain: Secondary | ICD-10-CM | POA: Diagnosis not present

## 2020-01-21 DIAGNOSIS — F17210 Nicotine dependence, cigarettes, uncomplicated: Secondary | ICD-10-CM | POA: Diagnosis not present

## 2020-01-21 DIAGNOSIS — J181 Lobar pneumonia, unspecified organism: Secondary | ICD-10-CM | POA: Diagnosis not present

## 2020-01-21 DIAGNOSIS — R000 Tachycardia, unspecified: Secondary | ICD-10-CM | POA: Diagnosis not present

## 2020-01-21 DIAGNOSIS — R0602 Shortness of breath: Secondary | ICD-10-CM | POA: Diagnosis not present

## 2020-01-21 DIAGNOSIS — E1165 Type 2 diabetes mellitus with hyperglycemia: Secondary | ICD-10-CM | POA: Diagnosis not present

## 2020-01-21 DIAGNOSIS — R739 Hyperglycemia, unspecified: Secondary | ICD-10-CM | POA: Diagnosis not present

## 2020-01-21 DIAGNOSIS — K7689 Other specified diseases of liver: Secondary | ICD-10-CM | POA: Diagnosis not present

## 2020-01-21 DIAGNOSIS — J9622 Acute and chronic respiratory failure with hypercapnia: Secondary | ICD-10-CM | POA: Diagnosis not present

## 2020-01-21 DIAGNOSIS — R7989 Other specified abnormal findings of blood chemistry: Secondary | ICD-10-CM | POA: Diagnosis not present

## 2020-01-22 DIAGNOSIS — J9611 Chronic respiratory failure with hypoxia: Secondary | ICD-10-CM | POA: Diagnosis not present

## 2020-01-22 DIAGNOSIS — J189 Pneumonia, unspecified organism: Secondary | ICD-10-CM | POA: Diagnosis not present

## 2020-01-22 DIAGNOSIS — J449 Chronic obstructive pulmonary disease, unspecified: Secondary | ICD-10-CM | POA: Diagnosis not present

## 2020-01-22 DIAGNOSIS — Z9981 Dependence on supplemental oxygen: Secondary | ICD-10-CM | POA: Diagnosis not present

## 2020-01-22 DIAGNOSIS — I10 Essential (primary) hypertension: Secondary | ICD-10-CM | POA: Diagnosis not present

## 2020-01-22 DIAGNOSIS — E119 Type 2 diabetes mellitus without complications: Secondary | ICD-10-CM | POA: Diagnosis not present

## 2020-01-22 DIAGNOSIS — D509 Iron deficiency anemia, unspecified: Secondary | ICD-10-CM | POA: Diagnosis not present

## 2020-01-22 DIAGNOSIS — R7989 Other specified abnormal findings of blood chemistry: Secondary | ICD-10-CM | POA: Diagnosis not present

## 2020-01-23 DIAGNOSIS — J850 Gangrene and necrosis of lung: Secondary | ICD-10-CM | POA: Diagnosis not present

## 2020-01-23 DIAGNOSIS — Z794 Long term (current) use of insulin: Secondary | ICD-10-CM | POA: Diagnosis not present

## 2020-01-23 DIAGNOSIS — D509 Iron deficiency anemia, unspecified: Secondary | ICD-10-CM | POA: Diagnosis not present

## 2020-01-23 DIAGNOSIS — E119 Type 2 diabetes mellitus without complications: Secondary | ICD-10-CM | POA: Diagnosis not present

## 2020-01-23 DIAGNOSIS — I10 Essential (primary) hypertension: Secondary | ICD-10-CM | POA: Diagnosis not present

## 2020-01-23 DIAGNOSIS — R000 Tachycardia, unspecified: Secondary | ICD-10-CM | POA: Diagnosis not present

## 2020-01-23 DIAGNOSIS — J159 Unspecified bacterial pneumonia: Secondary | ICD-10-CM | POA: Diagnosis not present

## 2020-01-23 DIAGNOSIS — J449 Chronic obstructive pulmonary disease, unspecified: Secondary | ICD-10-CM | POA: Diagnosis not present

## 2020-01-23 DIAGNOSIS — I491 Atrial premature depolarization: Secondary | ICD-10-CM | POA: Diagnosis not present

## 2020-01-23 DIAGNOSIS — J9611 Chronic respiratory failure with hypoxia: Secondary | ICD-10-CM | POA: Diagnosis not present

## 2020-01-24 DIAGNOSIS — J159 Unspecified bacterial pneumonia: Secondary | ICD-10-CM | POA: Diagnosis not present

## 2020-01-24 DIAGNOSIS — N200 Calculus of kidney: Secondary | ICD-10-CM | POA: Diagnosis not present

## 2020-01-24 DIAGNOSIS — J9611 Chronic respiratory failure with hypoxia: Secondary | ICD-10-CM | POA: Diagnosis not present

## 2020-01-24 DIAGNOSIS — I10 Essential (primary) hypertension: Secondary | ICD-10-CM | POA: Diagnosis not present

## 2020-01-24 DIAGNOSIS — J850 Gangrene and necrosis of lung: Secondary | ICD-10-CM | POA: Diagnosis not present

## 2020-01-24 DIAGNOSIS — J449 Chronic obstructive pulmonary disease, unspecified: Secondary | ICD-10-CM | POA: Diagnosis not present

## 2020-01-24 DIAGNOSIS — Z794 Long term (current) use of insulin: Secondary | ICD-10-CM | POA: Diagnosis not present

## 2020-01-24 DIAGNOSIS — D1779 Benign lipomatous neoplasm of other sites: Secondary | ICD-10-CM | POA: Diagnosis not present

## 2020-01-24 DIAGNOSIS — R0602 Shortness of breath: Secondary | ICD-10-CM | POA: Diagnosis not present

## 2020-01-24 DIAGNOSIS — E119 Type 2 diabetes mellitus without complications: Secondary | ICD-10-CM | POA: Diagnosis not present

## 2020-01-24 DIAGNOSIS — D509 Iron deficiency anemia, unspecified: Secondary | ICD-10-CM | POA: Diagnosis not present

## 2020-01-24 DIAGNOSIS — J90 Pleural effusion, not elsewhere classified: Secondary | ICD-10-CM | POA: Diagnosis not present

## 2020-01-24 DIAGNOSIS — J181 Lobar pneumonia, unspecified organism: Secondary | ICD-10-CM | POA: Diagnosis not present

## 2020-01-24 DIAGNOSIS — K7460 Unspecified cirrhosis of liver: Secondary | ICD-10-CM | POA: Diagnosis not present

## 2020-01-24 DIAGNOSIS — Z8507 Personal history of malignant neoplasm of pancreas: Secondary | ICD-10-CM | POA: Diagnosis not present

## 2020-01-24 DIAGNOSIS — K7689 Other specified diseases of liver: Secondary | ICD-10-CM | POA: Diagnosis not present

## 2020-01-25 DIAGNOSIS — I10 Essential (primary) hypertension: Secondary | ICD-10-CM | POA: Diagnosis not present

## 2020-01-25 DIAGNOSIS — Z794 Long term (current) use of insulin: Secondary | ICD-10-CM | POA: Diagnosis not present

## 2020-01-25 DIAGNOSIS — J9611 Chronic respiratory failure with hypoxia: Secondary | ICD-10-CM | POA: Diagnosis not present

## 2020-01-25 DIAGNOSIS — E119 Type 2 diabetes mellitus without complications: Secondary | ICD-10-CM | POA: Diagnosis not present

## 2020-01-25 DIAGNOSIS — J449 Chronic obstructive pulmonary disease, unspecified: Secondary | ICD-10-CM | POA: Diagnosis not present

## 2020-01-25 DIAGNOSIS — J159 Unspecified bacterial pneumonia: Secondary | ICD-10-CM | POA: Diagnosis not present

## 2020-01-25 DIAGNOSIS — D509 Iron deficiency anemia, unspecified: Secondary | ICD-10-CM | POA: Diagnosis not present

## 2020-01-25 DIAGNOSIS — J850 Gangrene and necrosis of lung: Secondary | ICD-10-CM | POA: Diagnosis not present

## 2020-01-26 DIAGNOSIS — D509 Iron deficiency anemia, unspecified: Secondary | ICD-10-CM | POA: Diagnosis not present

## 2020-01-26 DIAGNOSIS — J159 Unspecified bacterial pneumonia: Secondary | ICD-10-CM | POA: Diagnosis not present

## 2020-01-26 DIAGNOSIS — J850 Gangrene and necrosis of lung: Secondary | ICD-10-CM | POA: Diagnosis not present

## 2020-01-26 DIAGNOSIS — J449 Chronic obstructive pulmonary disease, unspecified: Secondary | ICD-10-CM | POA: Diagnosis not present

## 2020-01-26 DIAGNOSIS — J9611 Chronic respiratory failure with hypoxia: Secondary | ICD-10-CM | POA: Diagnosis not present

## 2020-01-26 DIAGNOSIS — E119 Type 2 diabetes mellitus without complications: Secondary | ICD-10-CM | POA: Diagnosis not present

## 2020-01-26 DIAGNOSIS — Z515 Encounter for palliative care: Secondary | ICD-10-CM | POA: Diagnosis not present

## 2020-01-26 DIAGNOSIS — I10 Essential (primary) hypertension: Secondary | ICD-10-CM | POA: Diagnosis not present

## 2020-01-26 DIAGNOSIS — Z794 Long term (current) use of insulin: Secondary | ICD-10-CM | POA: Diagnosis not present

## 2020-01-26 DIAGNOSIS — R739 Hyperglycemia, unspecified: Secondary | ICD-10-CM | POA: Diagnosis not present

## 2020-01-26 DIAGNOSIS — Z789 Other specified health status: Secondary | ICD-10-CM | POA: Diagnosis not present

## 2020-01-26 DIAGNOSIS — Z7409 Other reduced mobility: Secondary | ICD-10-CM | POA: Diagnosis not present

## 2020-01-27 DIAGNOSIS — E119 Type 2 diabetes mellitus without complications: Secondary | ICD-10-CM | POA: Diagnosis not present

## 2020-01-27 DIAGNOSIS — J159 Unspecified bacterial pneumonia: Secondary | ICD-10-CM | POA: Diagnosis not present

## 2020-01-27 DIAGNOSIS — Z794 Long term (current) use of insulin: Secondary | ICD-10-CM | POA: Diagnosis not present

## 2020-01-27 DIAGNOSIS — J449 Chronic obstructive pulmonary disease, unspecified: Secondary | ICD-10-CM | POA: Diagnosis not present

## 2020-01-27 DIAGNOSIS — J850 Gangrene and necrosis of lung: Secondary | ICD-10-CM | POA: Diagnosis not present

## 2020-01-27 DIAGNOSIS — I10 Essential (primary) hypertension: Secondary | ICD-10-CM | POA: Diagnosis not present

## 2020-01-27 DIAGNOSIS — J9611 Chronic respiratory failure with hypoxia: Secondary | ICD-10-CM | POA: Diagnosis not present

## 2020-01-27 DIAGNOSIS — D509 Iron deficiency anemia, unspecified: Secondary | ICD-10-CM | POA: Diagnosis not present

## 2020-01-28 DIAGNOSIS — J159 Unspecified bacterial pneumonia: Secondary | ICD-10-CM | POA: Diagnosis not present

## 2020-01-28 DIAGNOSIS — J449 Chronic obstructive pulmonary disease, unspecified: Secondary | ICD-10-CM | POA: Diagnosis not present

## 2020-01-28 DIAGNOSIS — E119 Type 2 diabetes mellitus without complications: Secondary | ICD-10-CM | POA: Diagnosis not present

## 2020-01-28 DIAGNOSIS — J9611 Chronic respiratory failure with hypoxia: Secondary | ICD-10-CM | POA: Diagnosis not present

## 2020-01-28 DIAGNOSIS — J850 Gangrene and necrosis of lung: Secondary | ICD-10-CM | POA: Diagnosis not present

## 2020-01-28 DIAGNOSIS — I10 Essential (primary) hypertension: Secondary | ICD-10-CM | POA: Diagnosis not present

## 2020-01-28 DIAGNOSIS — J189 Pneumonia, unspecified organism: Secondary | ICD-10-CM | POA: Diagnosis not present

## 2020-01-28 DIAGNOSIS — Z794 Long term (current) use of insulin: Secondary | ICD-10-CM | POA: Diagnosis not present

## 2020-01-28 DIAGNOSIS — R109 Unspecified abdominal pain: Secondary | ICD-10-CM | POA: Diagnosis not present

## 2020-01-28 DIAGNOSIS — D509 Iron deficiency anemia, unspecified: Secondary | ICD-10-CM | POA: Diagnosis not present

## 2020-01-29 DIAGNOSIS — E119 Type 2 diabetes mellitus without complications: Secondary | ICD-10-CM | POA: Diagnosis not present

## 2020-01-29 DIAGNOSIS — I10 Essential (primary) hypertension: Secondary | ICD-10-CM | POA: Diagnosis not present

## 2020-01-29 DIAGNOSIS — J850 Gangrene and necrosis of lung: Secondary | ICD-10-CM | POA: Diagnosis not present

## 2020-01-29 DIAGNOSIS — D509 Iron deficiency anemia, unspecified: Secondary | ICD-10-CM | POA: Diagnosis not present

## 2020-01-29 DIAGNOSIS — J449 Chronic obstructive pulmonary disease, unspecified: Secondary | ICD-10-CM | POA: Diagnosis not present

## 2020-01-29 DIAGNOSIS — J9611 Chronic respiratory failure with hypoxia: Secondary | ICD-10-CM | POA: Diagnosis not present

## 2020-01-29 DIAGNOSIS — I491 Atrial premature depolarization: Secondary | ICD-10-CM | POA: Diagnosis not present

## 2020-01-29 DIAGNOSIS — Z794 Long term (current) use of insulin: Secondary | ICD-10-CM | POA: Diagnosis not present

## 2020-01-29 DIAGNOSIS — R000 Tachycardia, unspecified: Secondary | ICD-10-CM | POA: Diagnosis not present

## 2020-01-29 DIAGNOSIS — J159 Unspecified bacterial pneumonia: Secondary | ICD-10-CM | POA: Diagnosis not present

## 2020-02-04 DEATH — deceased
